# Patient Record
Sex: Male | Born: 1960 | Race: White | Hispanic: No | Marital: Married | Smoking: Never smoker
Health system: Southern US, Community
[De-identification: ages and names within clinical notes are randomized; demographics above are authoritative.]

---
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
## 2008-10-28 HISTORY — PX: BACK SURGERY: SHX140

## 2016-06-28 ENCOUNTER — Other Ambulatory Visit: Payer: Self-pay | Admitting: Physical Medicine and Rehabilitation

## 2016-06-28 ENCOUNTER — Ambulatory Visit
Admission: RE | Admit: 2016-06-28 | Discharge: 2016-06-28 | Disposition: A | Discharge status: Home / Self Care | Payer: No Typology Code available for payment source | Source: Ambulatory Visit | Attending: Physical Medicine and Rehabilitation | Admitting: Physical Medicine and Rehabilitation

## 2016-06-28 DIAGNOSIS — Z0000 Encounter for general adult medical examination without abnormal findings: Secondary | ICD-10-CM

## 2016-11-07 DIAGNOSIS — R911 Solitary pulmonary nodule: Secondary | ICD-10-CM | POA: Diagnosis not present

## 2016-11-22 ENCOUNTER — Other Ambulatory Visit (HOSPITAL_COMMUNITY): Payer: Self-pay | Admitting: Internal Medicine

## 2016-11-22 DIAGNOSIS — R918 Other nonspecific abnormal finding of lung field: Secondary | ICD-10-CM

## 2016-12-03 DIAGNOSIS — Z0000 Encounter for general adult medical examination without abnormal findings: Secondary | ICD-10-CM | POA: Diagnosis not present

## 2016-12-05 ENCOUNTER — Ambulatory Visit (HOSPITAL_COMMUNITY): Payer: Self-pay

## 2016-12-06 ENCOUNTER — Ambulatory Visit (HOSPITAL_COMMUNITY)
Admission: RE | Admit: 2016-12-06 | Discharge: 2016-12-06 | Disposition: A | Discharge status: Home / Self Care | Payer: 59 | Source: Ambulatory Visit | Attending: Internal Medicine | Admitting: Internal Medicine

## 2016-12-06 DIAGNOSIS — R918 Other nonspecific abnormal finding of lung field: Secondary | ICD-10-CM

## 2016-12-06 DIAGNOSIS — J8410 Pulmonary fibrosis, unspecified: Secondary | ICD-10-CM | POA: Insufficient documentation

## 2016-12-06 DIAGNOSIS — R911 Solitary pulmonary nodule: Secondary | ICD-10-CM | POA: Diagnosis not present

## 2016-12-10 DIAGNOSIS — E7800 Pure hypercholesterolemia, unspecified: Secondary | ICD-10-CM | POA: Diagnosis not present

## 2016-12-10 DIAGNOSIS — Z0000 Encounter for general adult medical examination without abnormal findings: Secondary | ICD-10-CM | POA: Diagnosis not present

## 2016-12-10 DIAGNOSIS — R911 Solitary pulmonary nodule: Secondary | ICD-10-CM | POA: Diagnosis not present

## 2018-06-15 DIAGNOSIS — E7800 Pure hypercholesterolemia, unspecified: Secondary | ICD-10-CM | POA: Diagnosis not present

## 2018-06-17 DIAGNOSIS — Z0000 Encounter for general adult medical examination without abnormal findings: Secondary | ICD-10-CM | POA: Diagnosis not present

## 2018-06-17 DIAGNOSIS — R911 Solitary pulmonary nodule: Secondary | ICD-10-CM | POA: Diagnosis not present

## 2018-06-17 DIAGNOSIS — E782 Mixed hyperlipidemia: Secondary | ICD-10-CM | POA: Diagnosis not present

## 2018-08-19 IMAGING — CR DG CHEST 1V
1 series · 1 of 1 positions shown · non-contrast
Comparison: None in PACs

CLINICAL DATA: Lung screening for employment, no chest complaints,
nonsmoker.

EXAM:
CHEST 1 VIEW

[w chest pa]
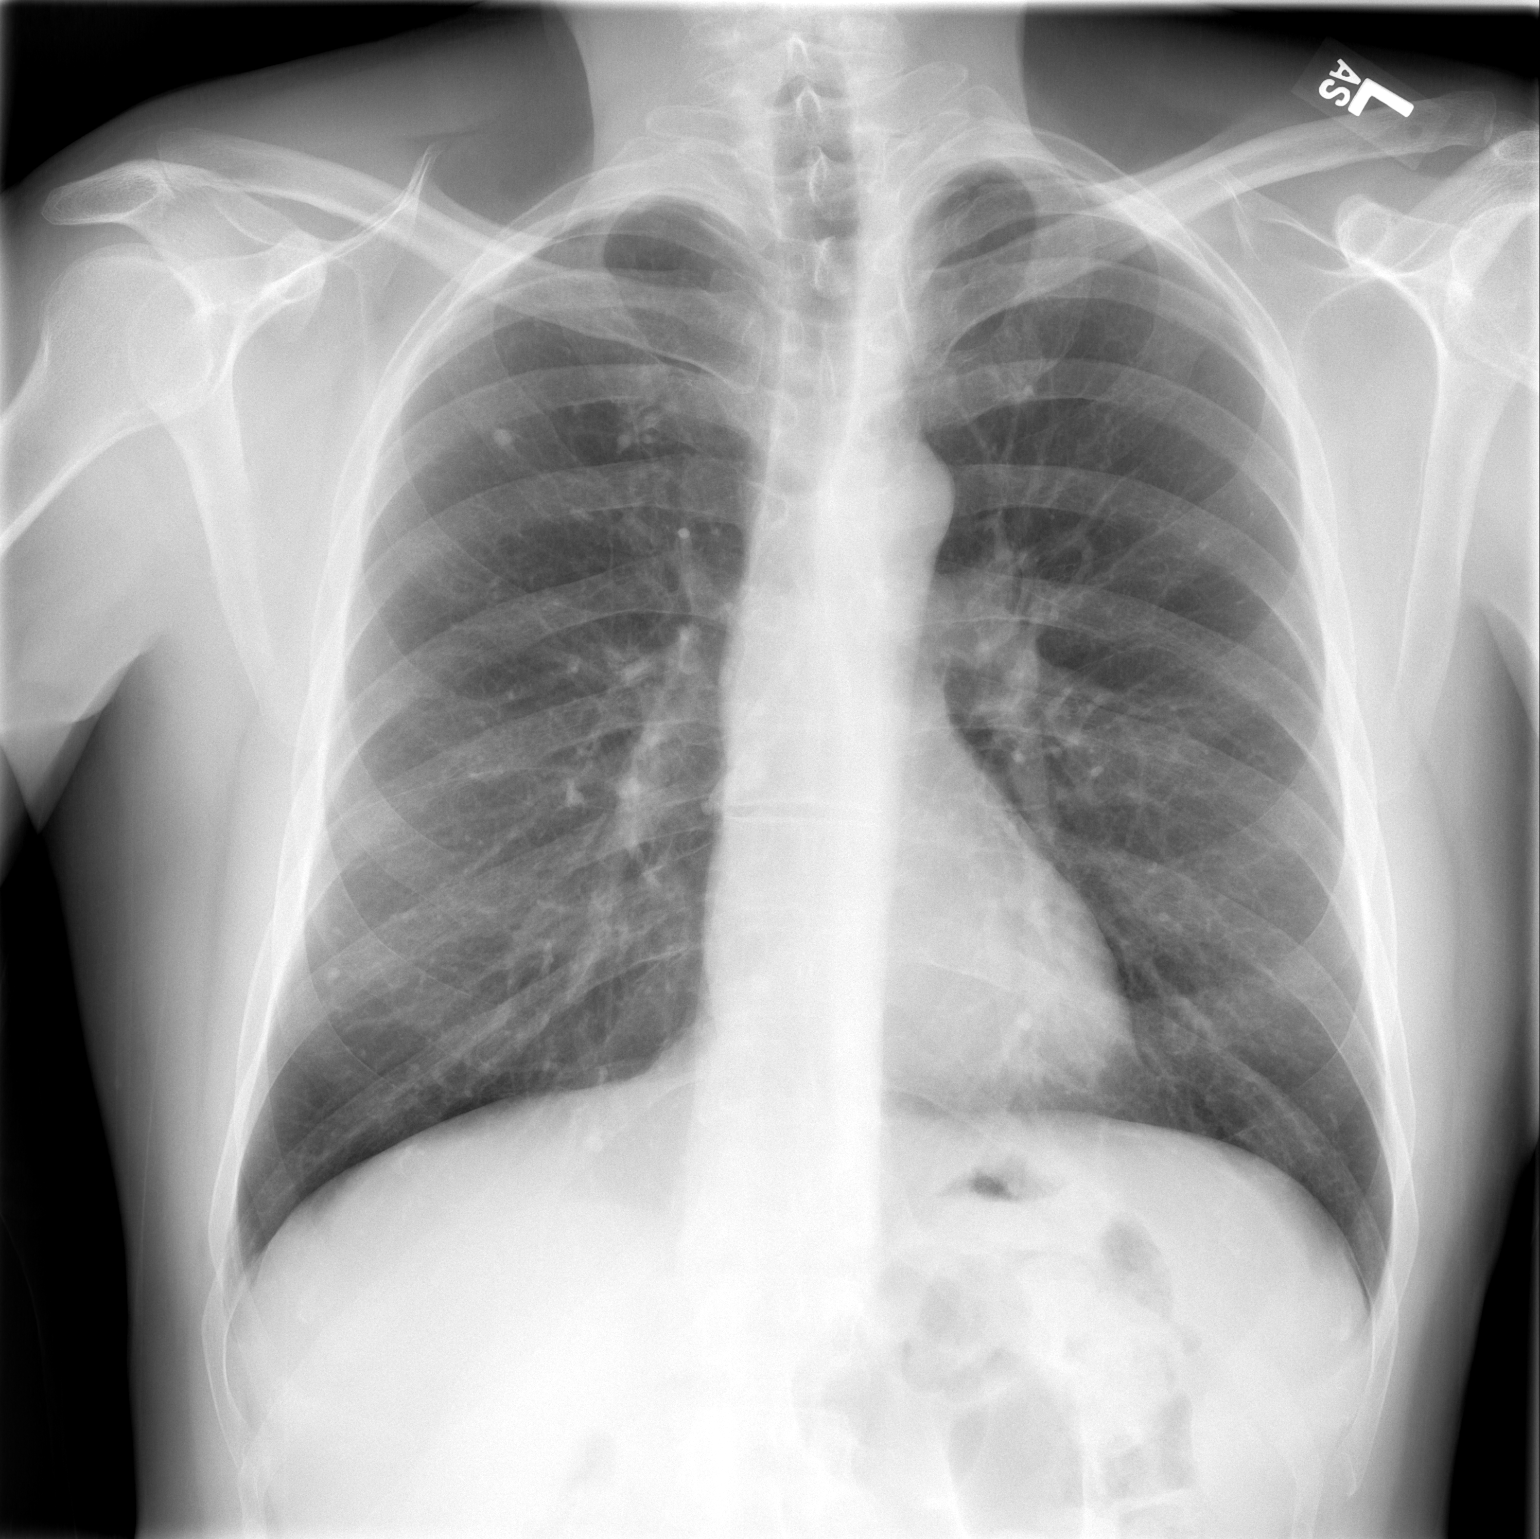

[1 of 1 positions shown; findings below may reference images not displayed]

FINDINGS: There are multiple calcified parenchymal nodules in both lungs
greatest in the right upper lobe. The largest measures approximately
4 mm in diameter. There is no alveolar infiltrate or pleural
effusion. The heart and pulmonary vascularity are normal. The
mediastinum is normal in width. The bony thorax exhibits no acute
abnormality.
IMPRESSION: Previous granulomatous infection. No acute cardiopulmonary
abnormality.

## 2018-09-14 ENCOUNTER — Other Ambulatory Visit: Payer: Self-pay | Admitting: Occupational Medicine

## 2018-09-14 ENCOUNTER — Ambulatory Visit: Payer: Self-pay

## 2018-09-14 DIAGNOSIS — Z0000 Encounter for general adult medical examination without abnormal findings: Secondary | ICD-10-CM

## 2018-10-06 DIAGNOSIS — Z0000 Encounter for general adult medical examination without abnormal findings: Secondary | ICD-10-CM | POA: Diagnosis not present

## 2018-10-06 DIAGNOSIS — R911 Solitary pulmonary nodule: Secondary | ICD-10-CM | POA: Diagnosis not present

## 2018-10-06 DIAGNOSIS — E782 Mixed hyperlipidemia: Secondary | ICD-10-CM | POA: Diagnosis not present

## 2018-10-06 DIAGNOSIS — I10 Essential (primary) hypertension: Secondary | ICD-10-CM | POA: Diagnosis not present

## 2019-07-12 ENCOUNTER — Encounter: Payer: Self-pay | Admitting: *Deleted

## 2019-08-03 ENCOUNTER — Ambulatory Visit: Payer: 59

## 2019-08-04 ENCOUNTER — Telehealth: Payer: Self-pay | Admitting: *Deleted

## 2019-08-04 ENCOUNTER — Encounter: Payer: Self-pay | Admitting: Gastroenterology

## 2019-08-04 ENCOUNTER — Ambulatory Visit: Payer: 59

## 2019-08-04 NOTE — Telephone Encounter (Signed)
Patient was a no show and letter sent  °

## 2019-08-04 NOTE — Telephone Encounter (Signed)
Noted  

## 2019-08-18 ENCOUNTER — Ambulatory Visit: Payer: 59

## 2019-08-31 ENCOUNTER — Ambulatory Visit (INDEPENDENT_AMBULATORY_CARE_PROVIDER_SITE_OTHER): Payer: Self-pay | Admitting: *Deleted

## 2019-08-31 ENCOUNTER — Other Ambulatory Visit: Payer: Self-pay

## 2019-08-31 DIAGNOSIS — Z1211 Encounter for screening for malignant neoplasm of colon: Secondary | ICD-10-CM

## 2019-08-31 MED ORDER — NA SULFATE-K SULFATE-MG SULF 17.5-3.13-1.6 GM/177ML PO SOLN: 1.0000 | Freq: Once | ORAL | 0 refills | Status: AC

## 2019-08-31 NOTE — Patient Instructions (Signed)
Mario Barajas  1960/12/15 MRN: [id number]     Procedure Date: 11/26/2019 Time to register: 9:30 am Place to register: [facility] Procedure Time: 10:30 am Scheduled provider: Dr. Gala Romney    PREPARATION FOR COLONOSCOPY WITH SUPREP BOWEL PREP KIT  Note: Suprep Bowel Prep Kit is a split-dose (2day) regimen. Consumption of BOTH 6-ounce bottles is required for a complete prep.  Please notify us immediately if you are diabetic, take iron supplements, or if you are on Coumadin or any other blood thinners.                                                                                                                                                   2 DAYS BEFORE PROCEDURE:  DATE: 11/24/2019   DAY: Wednesday Begin clear liquid diet AFTER your lunch meal. NO SOLID FOODS after this point.  1 DAY BEFORE PROCEDURE:  DATE: 11/25/2019   DAY: Thursday Continue clear liquids the entire day - NO SOLID FOOD.    At 6:00pm: Complete steps 1 through 4 below, using ONE (1) 6-ounce bottle, before going to bed. Step 1:  Pour ONE (1) 6-ounce bottle of SUPREP liquid into the mixing container.  Step 2:  Add cool drinking water to the 16 ounce line on the container and mix.  Note: Dilute the solution concentrate as directed prior to use. Step 3:  DRINK ALL the liquid in the container. Step 4:  You MUST drink an additional two (2) or more 16 ounce containers of water over the next one (1) hour.   Continue clear liquids.  DAY OF PROCEDURE:   DATE: 11/26/2019   DAY: Friday If you take medications for your heart, blood pressure, or breathing, you may take these medications.    5 hours before your procedure at :  5:30 am Step 1:  Pour ONE (1) 6-ounce bottle of SUPREP liquid into the mixing container.  Step 2:  Add cool drinking water to the 16 ounce line on the container and mix.  Note: Dilute the solution concentrate as directed prior to use. Step 3:  DRINK ALL the liquid in the container. Step 4:   You MUST drink an additional two (2) or more 16 ounce containers of water over the next one (1) hour. You MUST complete the final glass of water at least 3 hours before your colonoscopy.  Nothing by mouth past 7:30 am  You may take your morning medications with sip of water unless we have instructed otherwise.    Please see below for Dietary Information.  CLEAR LIQUIDS INCLUDE:  Water Jello (NOT red in color)   Ice Popsicles (NOT red in color)   Tea (sugar ok, no milk/cream) Powdered fruit flavored drinks  Coffee (sugar ok, no milk/cream) Gatorade/ Lemonade/ Kool-Aid  (NOT red in color)   Juice: apple, white grape,  white cranberry Soft drinks  Clear bullion, consomme, broth (fat free beef/chicken/vegetable)  Carbonated beverages (any kind)  Strained chicken noodle soup Hard Candy   Remember: Clear liquids are liquids that will allow you to see your fingers on the other side of a clear glass. Be sure liquids are NOT red in color, and not cloudy, but CLEAR.  DO NOT EAT OR DRINK ANY OF THE FOLLOWING:  Dairy products of any kind   Cranberry juice Tomato juice / V8 juice   Grapefruit juice Orange juice     Red grape juice  Do not eat any solid foods, including such foods as: cereal, oatmeal, yogurt, fruits, vegetables, creamed soups, eggs, bread, crackers, pureed foods in a blender, etc.   HELPFUL HINTS FOR DRINKING PREP SOLUTION:   Make sure prep is extremely cold. Mix and refrigerate the the morning of the prep. You may also put in the freezer.   You may try mixing some Crystal Light or Country Time Lemonade if you prefer. Mix in small amounts; add more if necessary.  Try drinking through a straw  Rinse mouth with water or a mouthwash between glasses, to remove after-taste.  Try sipping on a cold beverage /ice/ popsicles between glasses of prep.  Place a piece of sugar-free hard candy in mouth between glasses.  If you become nauseated, try consuming smaller amounts, or stretch  out the time between glasses. Stop for 30-60 minutes, then slowly start back drinking.     OTHER INSTRUCTIONS  You will need a responsible adult at least 60 years of age to accompany you and drive you home. This person must remain in the waiting room during your procedure. The hospital will cancel your procedure if you do not have a responsible adult with you.   1. Wear loose fitting clothing that is easily removed. 2. Leave jewelry and other valuables at home.  3. Remove all body piercing jewelry and leave at home. 4. Total time from sign-in until discharge is approximately 2-3 hours. 5. You should go home directly after your procedure and rest. You can resume normal activities the day after your procedure. 6. The day of your procedure you should not:  Drive  Make legal decisions  Operate machinery  Drink alcohol  Return to work   You may call the office (Dept: [phone]) before 5:00pm, or page the doctor on call [phone]) after 5:00pm, for further instructions, if necessary.   Insurance Information YOU WILL NEED TO CHECK WITH YOUR INSURANCE COMPANY FOR THE BENEFITS OF COVERAGE YOU HAVE FOR THIS PROCEDURE.  UNFORTUNATELY, NOT ALL INSURANCE COMPANIES HAVE BENEFITS TO COVER ALL OR PART OF THESE TYPES OF PROCEDURES.  IT IS YOUR RESPONSIBILITY TO CHECK YOUR BENEFITS, HOWEVER, WE WILL BE GLAD TO ASSIST YOU WITH ANY CODES YOUR INSURANCE COMPANY MAY NEED.    PLEASE NOTE THAT MOST INSURANCE COMPANIES WILL NOT COVER A SCREENING COLONOSCOPY FOR PEOPLE UNDER THE AGE OF 50  IF YOU HAVE BCBS INSURANCE, YOU MAY HAVE BENEFITS FOR A SCREENING COLONOSCOPY BUT IF POLYPS ARE FOUND THE DIAGNOSIS WILL CHANGE AND THEN YOU MAY HAVE A DEDUCTIBLE THAT WILL NEED TO BE MET. SO PLEASE MAKE SURE YOU CHECK YOUR BENEFITS FOR A SCREENING COLONOSCOPY AS WELL AS A DIAGNOSTIC COLONOSCOPY.

## 2019-08-31 NOTE — Progress Notes (Addendum)
Gastroenterology Pre-Procedure Review  Request Date: 08/31/2019 Requesting Physician: Dr. Wende Neighbors, Last TCS done 10 years ago in [place], no polyps per pt  PATIENT REVIEW QUESTIONS: The patient responded to the following health history questions as indicated:    1. Diabetes Melitis: no 2. Joint replacements in the past 12 months: no 3. Major health problems in the past 3 months: no 4. Has an artificial valve or MVP: yes, pt says he has mitral valve prolapse (30 years ago) 5. Has a defibrillator: no 6. Has been advised in past to take antibiotics in advance of a procedure like teeth cleaning: yes 7. Family history of colon cancer: no  8. Alcohol Use: yes, 2 or 3 beers on weekend 9. Illicit drug Use: no 10. History of sleep apnea: no  11. History of coronary artery or other vascular stents placed within the last 12 months: no 12. History of any prior anesthesia complications: no 13. There is no height or weight on file to calculate BMI. ht: 6'0 wt: 195 lbs    MEDICATIONS & ALLERGIES:    Patient reports the following regarding taking any blood thinners:   Plavix? no Aspirin? no Coumadin? no Brilinta? no Xarelto? no Eliquis? no Pradaxa? no Savaysa? no Effient? no  Patient confirms/reports the following medications:  Current Outpatient Medications  Medication Sig Dispense Refill  . Cholecalciferol (VITAMIN D3 PO) Take by mouth daily.    . Coenzyme Q10 (CO Q10 PO) Take by mouth daily.    Marland Kitchen MAGNESIUM PO Take by mouth daily.    . Multiple Vitamins-Minerals (MULTIVITAMIN ADULTS 50+) TABS Take by mouth daily.    Marland Kitchen olmesartan (BENICAR) 20 MG tablet Take 20 mg by mouth daily.     No current facility-administered medications for this visit.     Patient confirms/reports the following allergies:  No Known Allergies  No orders of the defined types were placed in this encounter.   [place] INFORMATION Primary Insurance: [facility]  [place] #: [id number],  Group #: [id number] Pre-Cert / Auth  required: Yes, approved online 11/26/2019 to [id number] Pre-Cert / Josem Kaufmann #: [id number]  SCHEDULE INFORMATION: Procedure has been scheduled as follows:  Date: 11/26/2019, Time: 10:30 Location: [facility] with Dr. Gala Romney  This Gastroenterology Pre-Precedure Review Form is being routed to the following provider(s): Neil Crouch, PA-C

## 2019-09-01 NOTE — Progress Notes (Signed)
Ok to schedule.

## 2019-09-01 NOTE — Addendum Note (Signed)
Addended by: Metro Kung on: 09/01/2019 01:43 PM   Modules accepted: Orders, SmartSet

## 2019-10-04 ENCOUNTER — Telehealth: Payer: Self-pay

## 2019-10-04 NOTE — Telephone Encounter (Signed)
Pt called and wants to cancel his TCS due to work and covid. Pt will call back when he is able to have his procedure done.

## 2019-10-05 NOTE — Telephone Encounter (Signed)
Noted.  Called and notified Hoyle Sauer in Endo.

## 2019-11-24 ENCOUNTER — Other Ambulatory Visit (HOSPITAL_COMMUNITY): Payer: 59

## 2019-11-26 ENCOUNTER — Ambulatory Visit (HOSPITAL_COMMUNITY): Admit: 2019-11-26 | Payer: 59 | Admitting: Internal Medicine

## 2019-11-26 ENCOUNTER — Encounter (HOSPITAL_COMMUNITY): Payer: Self-pay

## 2019-11-26 SURGERY — COLONOSCOPY
Anesthesia: Moderate Sedation

## 2020-08-29 ENCOUNTER — Other Ambulatory Visit (HOSPITAL_COMMUNITY): Payer: Self-pay | Admitting: Internal Medicine

## 2020-08-29 DIAGNOSIS — I25119 Atherosclerotic heart disease of native coronary artery with unspecified angina pectoris: Secondary | ICD-10-CM

## 2020-09-11 ENCOUNTER — Other Ambulatory Visit: Payer: Self-pay

## 2020-09-11 ENCOUNTER — Ambulatory Visit (HOSPITAL_COMMUNITY)
Admission: RE | Admit: 2020-09-11 | Discharge: 2020-09-11 | Disposition: A | Discharge status: Home / Self Care | Payer: 59 | Source: Ambulatory Visit | Attending: Internal Medicine | Admitting: Internal Medicine

## 2020-09-11 DIAGNOSIS — I25119 Atherosclerotic heart disease of native coronary artery with unspecified angina pectoris: Secondary | ICD-10-CM | POA: Diagnosis not present

## 2020-11-04 IMAGING — DX DG CHEST 1V
1 series · 1 of 1 positions shown · non-contrast
Comparison: Chest CT 12/06/2016 and radiograph 06/28/2016

CLINICAL DATA: Physical exam.

EXAM:
CHEST  1 VIEW

[chest pa]
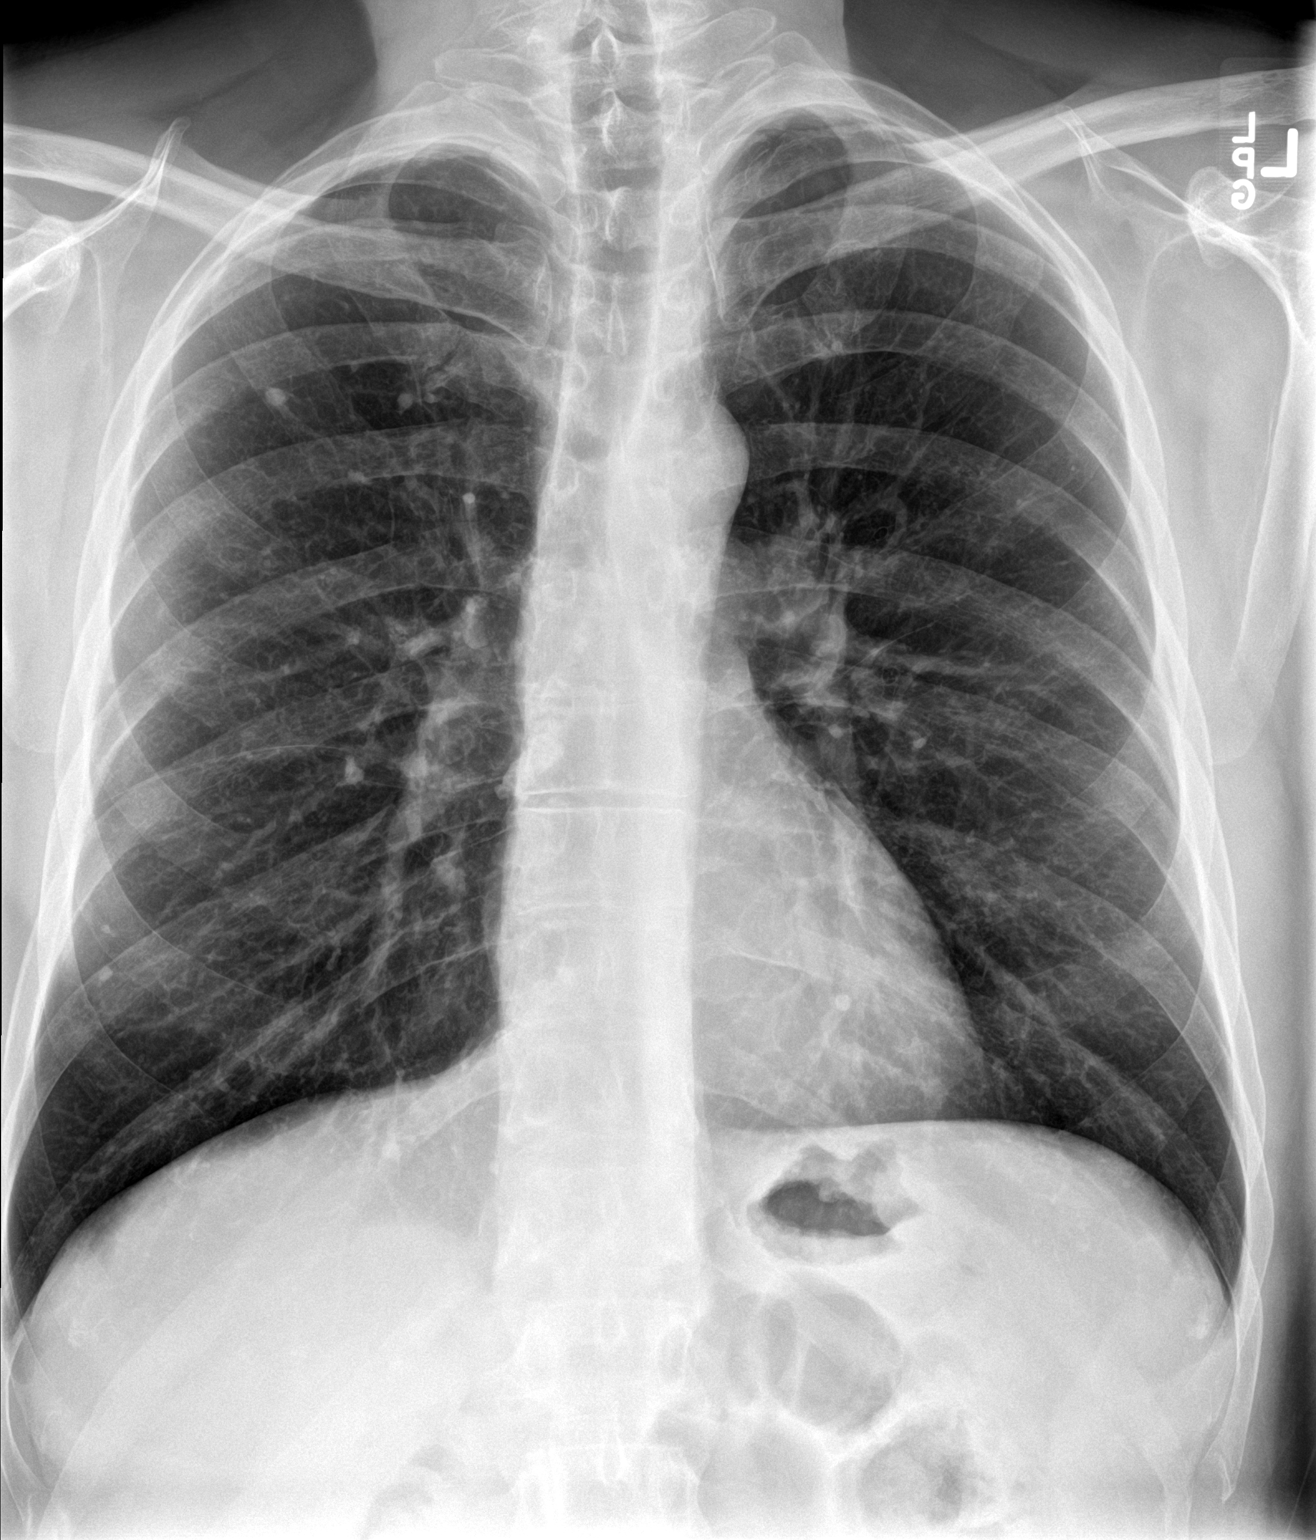

[1 of 1 positions shown; findings below may reference images not displayed]

FINDINGS: The cardiomediastinal silhouette is unchanged and within normal
limits. Small calcified mediastinal lymph nodes and small calcified
lung nodules are similar to the prior radiograph. No airspace
consolidation, edema, pleural effusion, pneumothorax is identified.
No acute osseous abnormality is seen.
IMPRESSION: Prior granulomatous infection without evidence of active
cardiopulmonary disease.

## 2021-08-29 DIAGNOSIS — L821 Other seborrheic keratosis: Secondary | ICD-10-CM

## 2021-08-29 DIAGNOSIS — E782 Mixed hyperlipidemia: Secondary | ICD-10-CM | POA: Insufficient documentation

## 2021-08-29 DIAGNOSIS — I10 Essential (primary) hypertension: Secondary | ICD-10-CM | POA: Insufficient documentation

## 2021-08-29 DIAGNOSIS — D229 Melanocytic nevi, unspecified: Secondary | ICD-10-CM

## 2021-08-29 HISTORY — DX: Other seborrheic keratosis: L82.1

## 2021-08-29 HISTORY — DX: Essential (primary) hypertension: I10

## 2021-08-29 HISTORY — DX: Mixed hyperlipidemia: E78.2

## 2021-08-29 HISTORY — DX: Melanocytic nevi, unspecified: D22.9

## 2021-09-10 DIAGNOSIS — I6529 Occlusion and stenosis of unspecified carotid artery: Secondary | ICD-10-CM | POA: Insufficient documentation

## 2021-09-10 DIAGNOSIS — L989 Disorder of the skin and subcutaneous tissue, unspecified: Secondary | ICD-10-CM | POA: Insufficient documentation

## 2021-09-10 DIAGNOSIS — R911 Solitary pulmonary nodule: Secondary | ICD-10-CM

## 2021-09-10 HISTORY — DX: Disorder of the skin and subcutaneous tissue, unspecified: L98.9

## 2021-09-10 HISTORY — DX: Occlusion and stenosis of unspecified carotid artery: I65.29

## 2021-09-10 HISTORY — DX: Solitary pulmonary nodule: R91.1

## 2021-09-11 ENCOUNTER — Other Ambulatory Visit: Payer: Self-pay | Admitting: Internal Medicine

## 2021-09-11 ENCOUNTER — Other Ambulatory Visit (HOSPITAL_COMMUNITY): Payer: Self-pay | Admitting: Internal Medicine

## 2021-09-11 DIAGNOSIS — I6529 Occlusion and stenosis of unspecified carotid artery: Secondary | ICD-10-CM

## 2021-09-19 ENCOUNTER — Other Ambulatory Visit: Payer: Self-pay

## 2021-09-19 ENCOUNTER — Ambulatory Visit (HOSPITAL_COMMUNITY)
Admission: RE | Admit: 2021-09-19 | Discharge: 2021-09-19 | Disposition: A | Discharge status: Home / Self Care | Payer: 59 | Source: Ambulatory Visit | Attending: Internal Medicine | Admitting: Internal Medicine

## 2021-09-19 DIAGNOSIS — I6529 Occlusion and stenosis of unspecified carotid artery: Secondary | ICD-10-CM | POA: Diagnosis not present

## 2021-12-22 DIAGNOSIS — H6691 Otitis media, unspecified, right ear: Secondary | ICD-10-CM | POA: Diagnosis not present

## 2021-12-22 DIAGNOSIS — H6690 Otitis media, unspecified, unspecified ear: Secondary | ICD-10-CM | POA: Insufficient documentation

## 2021-12-22 HISTORY — DX: Otitis media, unspecified, unspecified ear: H66.90

## 2022-06-20 DIAGNOSIS — H903 Sensorineural hearing loss, bilateral: Secondary | ICD-10-CM | POA: Diagnosis not present

## 2022-06-25 DIAGNOSIS — L989 Disorder of the skin and subcutaneous tissue, unspecified: Secondary | ICD-10-CM | POA: Diagnosis not present

## 2022-08-15 DIAGNOSIS — H903 Sensorineural hearing loss, bilateral: Secondary | ICD-10-CM | POA: Diagnosis not present

## 2022-10-31 DIAGNOSIS — Z0001 Encounter for general adult medical examination with abnormal findings: Secondary | ICD-10-CM | POA: Diagnosis not present

## 2022-10-31 DIAGNOSIS — E782 Mixed hyperlipidemia: Secondary | ICD-10-CM | POA: Diagnosis not present

## 2022-11-02 IMAGING — US US CAROTID DUPLEX BILAT
1 series · 13 of 24 positions shown · non-contrast
Comparison: None.

CLINICAL DATA: Atherosclerosis of coronary arteries.

EXAM:
BILATERAL CAROTID DUPLEX ULTRASOUND
TECHNIQUE: Gray scale imaging, color Doppler and duplex ultrasound were
performed of bilateral carotid and vertebral arteries in the neck.

[Series 1: us carotid bilateral · 13 of 70 slices shown]
[im 1/70]
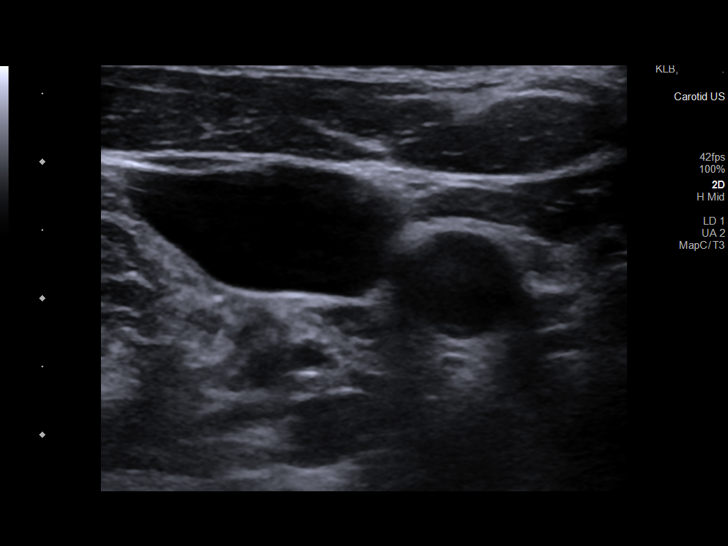
[im 7/70]
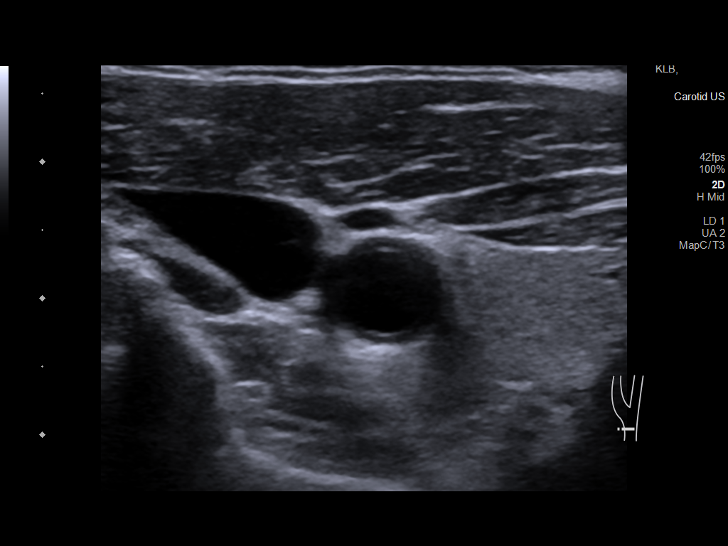
[im 13/70]
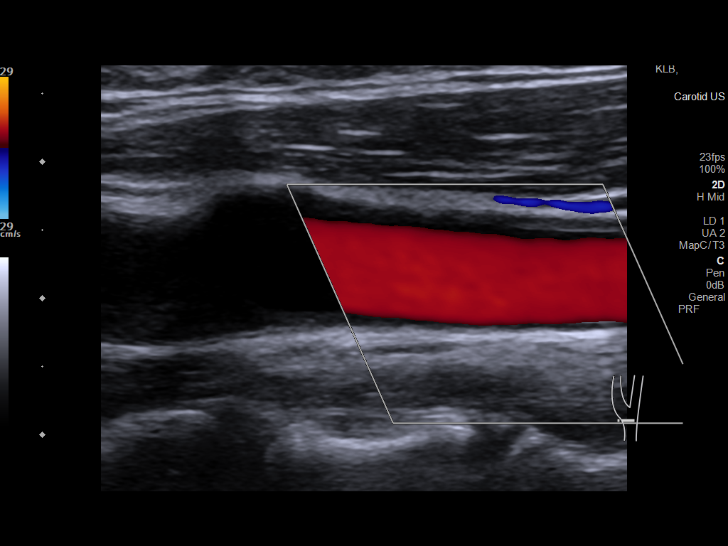
[im 19/70]
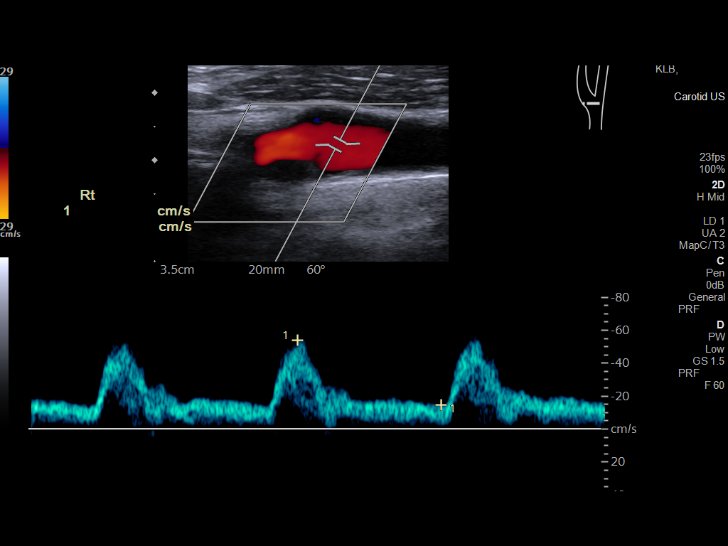
[im 25/70]
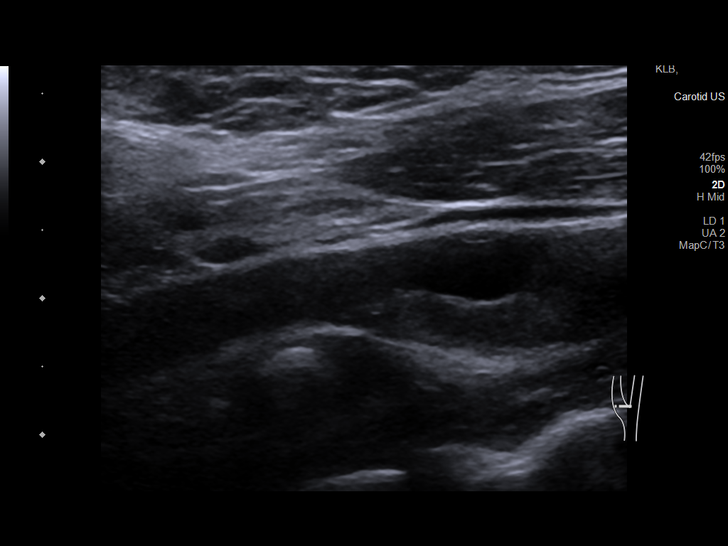
[im 31/70]
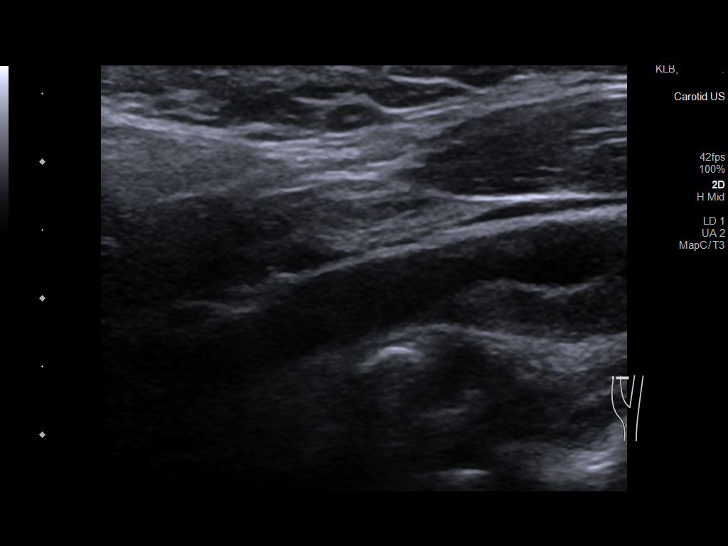
[im 37/70]
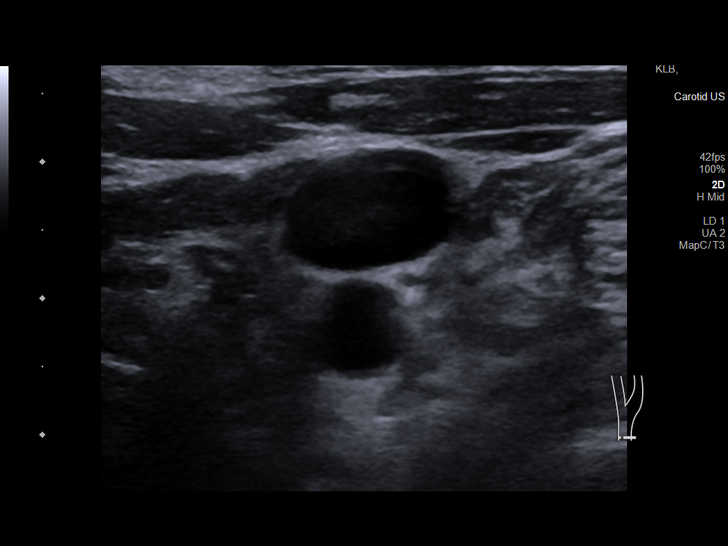
[im 40/70]
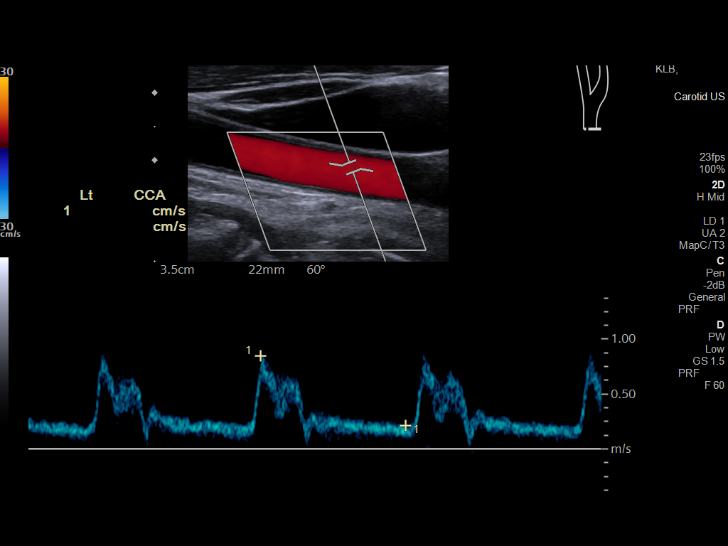
[im 46/70]
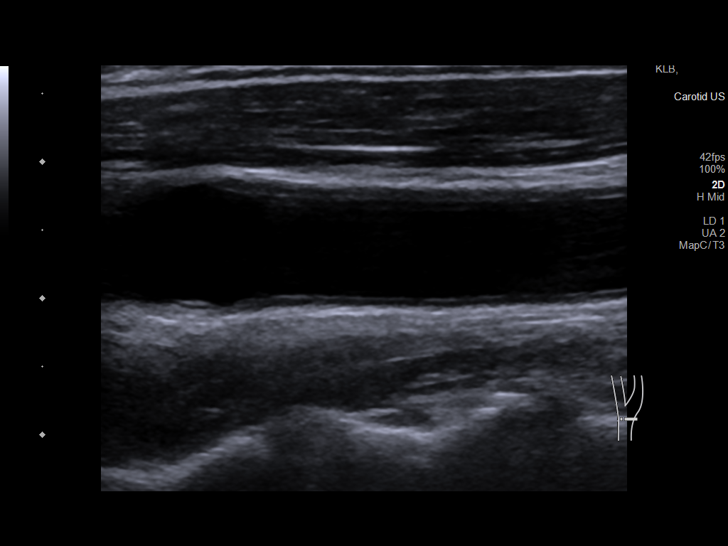
[im 52/70]
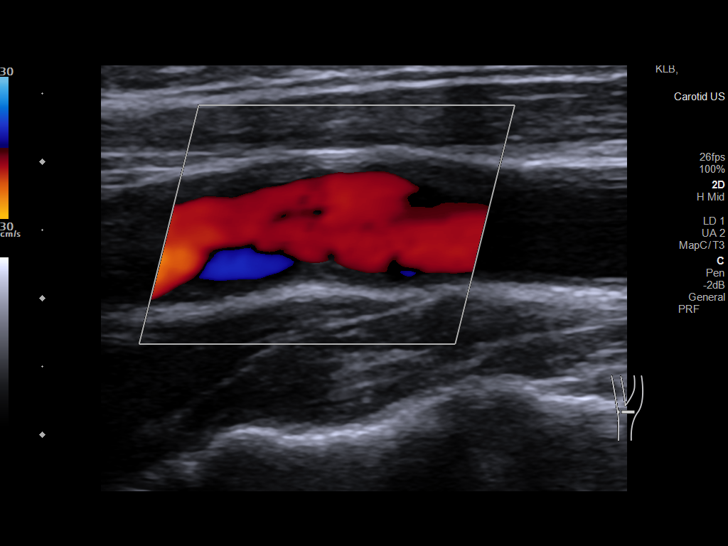
[im 58/70]
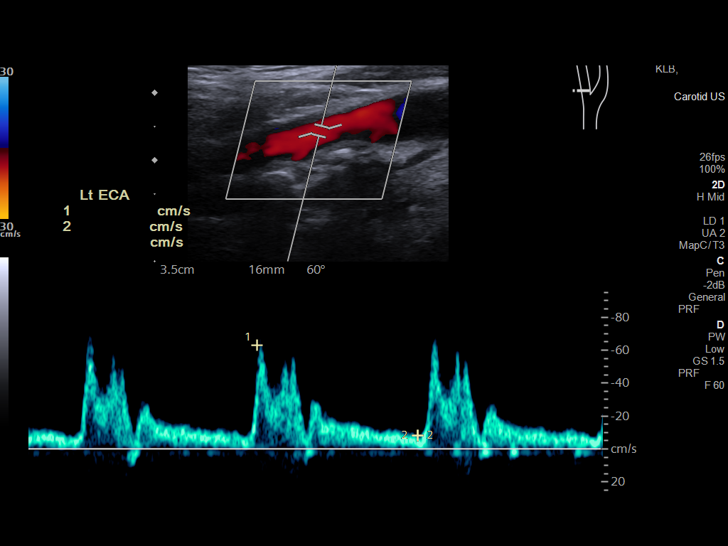
[im 64/70]
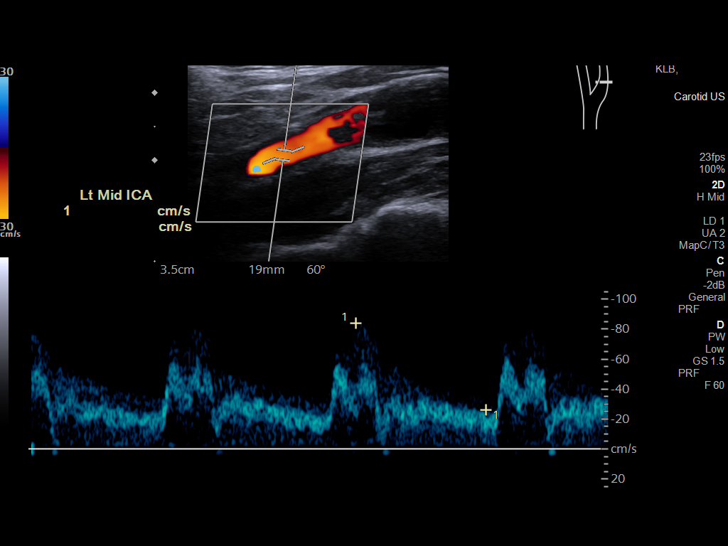
[im 70/70]
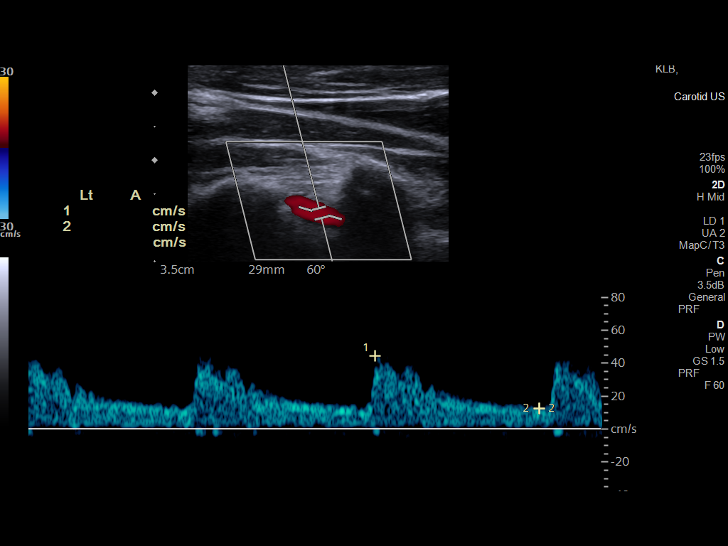

[13 of 24 positions shown; findings below may reference images not displayed]

FINDINGS: Criteria: Quantification of carotid stenosis is based on velocity
parameters that correlate the residual internal carotid diameter
with NASCET-based stenosis levels, using the diameter of the distal
internal carotid lumen as the denominator for stenosis measurement.

The following velocity measurements were obtained:

RIGHT

ICA: 270/91 cm/sec

CCA: 79/16 cm/sec

SYSTOLIC ICA/CCA RATIO:

ECA: 86 cm/sec

LEFT

ICA: 84/26 cm/sec

CCA: 78/20 cm/sec

SYSTOLIC ICA/CCA RATIO:

ECA: 63 cm/sec

RIGHT CAROTID ARTERY: Heterogeneous plaque at the right carotid bulb
and proximal internal carotid artery. External carotid artery is
patent with normal waveform. Elevated peak systolic velocity in the
proximal internal carotid artery measuring 270 cm/sec. The mid and
distal internal carotid artery are patent.

RIGHT VERTEBRAL ARTERY: Antegrade flow and normal waveform in the
right vertebral artery.

LEFT CAROTID ARTERY: Heterogeneous plaque at the left carotid bulb.
External carotid artery is patent with normal waveform. Intimal
thickening or minimal plaque in the left internal carotid artery.
Normal waveforms and velocities in the internal carotid artery.

LEFT VERTEBRAL ARTERY: Antegrade flow and normal waveform in the
left vertebral artery.
IMPRESSION: 1. Atherosclerotic disease involving bilateral carotid arteries,
right side greater than left.
2. Estimated degree of stenosis in right internal carotid artery is
greater than 70%.
3. Estimated degree of stenosis in left internal carotid artery is
less than 50%.
4. Patent vertebral arteries bilaterally.

These results will be called to the ordering clinician or
representative by the Radiologist Assistant, and communication
documented in the PACS or [REDACTED].

## 2022-11-07 ENCOUNTER — Other Ambulatory Visit (HOSPITAL_COMMUNITY): Payer: Self-pay | Admitting: Internal Medicine

## 2022-11-07 DIAGNOSIS — E782 Mixed hyperlipidemia: Secondary | ICD-10-CM | POA: Diagnosis not present

## 2022-11-07 DIAGNOSIS — R7301 Impaired fasting glucose: Secondary | ICD-10-CM | POA: Diagnosis not present

## 2022-11-07 DIAGNOSIS — I6523 Occlusion and stenosis of bilateral carotid arteries: Secondary | ICD-10-CM | POA: Diagnosis not present

## 2022-11-07 DIAGNOSIS — L989 Disorder of the skin and subcutaneous tissue, unspecified: Secondary | ICD-10-CM | POA: Diagnosis not present

## 2022-11-07 DIAGNOSIS — I6529 Occlusion and stenosis of unspecified carotid artery: Secondary | ICD-10-CM

## 2022-11-07 DIAGNOSIS — Z0001 Encounter for general adult medical examination with abnormal findings: Secondary | ICD-10-CM | POA: Diagnosis not present

## 2022-11-07 DIAGNOSIS — R911 Solitary pulmonary nodule: Secondary | ICD-10-CM | POA: Diagnosis not present

## 2022-11-07 DIAGNOSIS — I10 Essential (primary) hypertension: Secondary | ICD-10-CM | POA: Diagnosis not present

## 2022-11-07 HISTORY — DX: Impaired fasting glucose: R73.01

## 2022-11-11 ENCOUNTER — Encounter (INDEPENDENT_AMBULATORY_CARE_PROVIDER_SITE_OTHER): Payer: Self-pay | Admitting: *Deleted

## 2022-11-19 ENCOUNTER — Ambulatory Visit (HOSPITAL_COMMUNITY)
Admission: RE | Admit: 2022-11-19 | Discharge: 2022-11-19 | Disposition: A | Discharge status: Home / Self Care | Payer: BC Managed Care – PPO | Source: Ambulatory Visit | Attending: Internal Medicine | Admitting: Internal Medicine

## 2022-11-19 DIAGNOSIS — I6529 Occlusion and stenosis of unspecified carotid artery: Secondary | ICD-10-CM

## 2022-11-19 DIAGNOSIS — I6523 Occlusion and stenosis of bilateral carotid arteries: Secondary | ICD-10-CM | POA: Diagnosis not present

## 2022-12-03 ENCOUNTER — Encounter: Payer: Self-pay | Admitting: Vascular Surgery

## 2022-12-04 ENCOUNTER — Ambulatory Visit: Payer: BC Managed Care – PPO | Admitting: Vascular Surgery

## 2022-12-04 ENCOUNTER — Encounter: Payer: Self-pay | Admitting: Vascular Surgery

## 2022-12-04 VITALS — BP 148/88 | HR 57 | Temp 98.1°F | Ht 72.0 in | Wt 194.4 lb

## 2022-12-04 DIAGNOSIS — I6521 Occlusion and stenosis of right carotid artery: Secondary | ICD-10-CM | POA: Diagnosis not present

## 2022-12-04 NOTE — Progress Notes (Signed)
[facility] [place]  Patient name: Mario Barajas MRN: [id number] DOB: 13-Apr-1961 Sex: male  REASON FOR CONSULT: Evaluation right carotid stenosis  HPI: Mario Barajas is a 62 y.o. male, who is here today for discussion of carotid duplex suggesting severe carotid disease.  He is completely asymptomatic.  He denies any prior history of amaurosis fugax, aphasia, TIA or stroke.  He was found to have carotid bruit as the initial cause for his duplex several years ago.  He recently underwent repeat study at [facility] radiology.  He was felt to have potentially high-grade stenosis on the right.  Not had any cardiac disease.  He is not a cigarette smoker.  Past Medical History:  Diagnosis Date   Acute otitis media 12/22/2021   Carotid atherosclerosis 09/10/2021   Essential hypertension 08/29/2021   Impaired fasting glucose 11/07/2022   Lung nodule 09/10/2021   Melanocytic nevus 08/29/2021   Mixed hyperlipidemia 08/29/2021   Seborrheic keratosis 08/29/2021   Skin lesion 09/10/2021    Family History  Problem Relation Age of Onset   Heart disease Mother    Cancer Father    Cancer Sister     SOCIAL HISTORY: Social History   Socioeconomic History   Marital status: Married    Spouse name: Not on file   Number of children: Not on file   Years of education: Not on file   Highest education level: Not on file  Occupational History   Not on file  Tobacco Use   Smoking status: Never   Smokeless tobacco: Never  Vaping Use   Vaping Use: Never used  Substance and Sexual Activity   Alcohol use: Yes    Alcohol/week: 2.0 standard drinks of alcohol    Types: 2 Standard drinks or equivalent per week   Drug use: Never   Sexual activity: Not on file  Other Topics Concern   Not on file  Social History Narrative   Not on file   Social Determinants of Health   Financial Resource Strain: Not on file  Food Insecurity: Not on file   Transportation Needs: Not on file  Physical Activity: Not on file  Stress: Not on file  Social Connections: Not on file  Intimate Partner Violence: Not on file    No Known Allergies  Current Outpatient Medications  Medication Sig Dispense Refill   Cholecalciferol (VITAMIN D3 PO) Take by mouth daily.     Coenzyme Q10 (CO Q10 PO) Take by mouth daily.     MAGNESIUM PO Take by mouth daily.     Multiple Vitamins-Minerals (MULTIVITAMIN ADULTS 50+) TABS Take by mouth daily.     olmesartan (BENICAR) 20 MG tablet Take 20 mg by mouth daily.     No current facility-administered medications for this visit.    REVIEW OF SYSTEMS:  '[X]'$  denotes positive finding, '[ ]'$  denotes negative finding Cardiac  Comments:  Chest pain or chest pressure:    Shortness of breath upon exertion:    Short of breath when lying flat:    Irregular heart rhythm:        Vascular    Pain in calf, thigh, or hip brought on by ambulation:    Pain in feet at night that wakes you up from your sleep:     Blood clot in your veins:    Leg swelling:         Pulmonary    Oxygen at home:    Productive cough:  Wheezing:         Neurologic    Sudden weakness in arms or legs:     Sudden numbness in arms or legs:     Sudden onset of difficulty speaking or slurred speech:    Temporary loss of vision in one eye:     Problems with dizziness:         Gastrointestinal    Blood in stool:     Vomited blood:         Genitourinary    Burning when urinating:     Blood in urine:        Psychiatric    Major depression:         Hematologic    Bleeding problems:    Problems with blood clotting too easily:        Skin    Rashes or ulcers:        Constitutional    Fever or chills:      PHYSICAL EXAM: Vitals:   12/04/22 1515 12/04/22 1518  BP: (!) 153/91 (!) 148/88  Pulse: (!) 57   Temp: 98.1 F (36.7 C)   Weight: 194 lb 6.4 oz (88.2 kg)   Height: 6' (1.829 m)     GENERAL: The patient is a well-nourished  male, in no acute distress. The vital signs are documented above. CARDIOVASCULAR: He does have a soft right carotid bruit.  I do not appreciate a left carotid bruit.  He has a 2+ radial pulses bilaterally PULMONARY: There is good air exchange  MUSCULOSKELETAL: There are no major deformities or cyanosis. NEUROLOGIC: No focal weakness or paresthesias are detected. SKIN: There are no ulcers or rashes noted. PSYCHIATRIC: The patient has a normal affect.  DATA:  Carotid duplex 11/19/2022 was reviewed.  He was felt to have a at least 50-70 and more probably a 70 to 99% stenosis on the right.  There was no significant stenosis on the left.  MEDICAL ISSUES: Had a long discussion with the patient regarding this.  I explained the risk of stroke with high-grade asymptomatic disease.  I discussed carotid endarterectomy for treatment of this.  Feel that he certainly is indeterminant regarding his duplex.  I explained that we generally do endarterectomy based on duplex alone if we can see certain criteria.  I have recommended that we repeat his right carotid duplex to make further recommendation.  He understands that there is a chance that he would require CT angiogram if his duplex is indeterminant which would be unusual.  He works in [place] and requested his study be done in our [place] office.  We will coordinate this and also discussion with one of the [facility] surgeons with the study   Rosetta Posner, MD [facility] [phone] Pager [phone]  Note: Portions of this report may have been transcribed using voice recognition software.  Every effort has been made to ensure accuracy; however, inadvertent computerized transcription errors may still be present.

## 2022-12-05 ENCOUNTER — Other Ambulatory Visit: Payer: Self-pay | Admitting: *Deleted

## 2022-12-05 DIAGNOSIS — I6521 Occlusion and stenosis of right carotid artery: Secondary | ICD-10-CM

## 2022-12-18 ENCOUNTER — Other Ambulatory Visit: Payer: Self-pay

## 2022-12-18 DIAGNOSIS — I6521 Occlusion and stenosis of right carotid artery: Secondary | ICD-10-CM

## 2022-12-19 ENCOUNTER — Telehealth: Payer: Self-pay

## 2022-12-19 ENCOUNTER — Ambulatory Visit (HOSPITAL_COMMUNITY): Payer: BC Managed Care – PPO

## 2022-12-19 ENCOUNTER — Ambulatory Visit: Payer: BC Managed Care – PPO | Admitting: Vascular Surgery

## 2022-12-19 NOTE — Telephone Encounter (Signed)
-----   Message from [facility] sent at 12/19/2022  9:38 AM EST ----- Regarding: RE: CTA Appt linked to 2/26 CT - per tech at [facility] scans can be done at the same time   ----- Message ----- From: Kaleen Mask, LPN Sent: [id number]  10:20 AM EST To: April H Pait; Cathlean Cower; Roosvelt Maser; # Subject: CTA                                            Please schedule prior to appt on 01/16/2023.  Thanks!

## 2022-12-23 ENCOUNTER — Other Ambulatory Visit (HOSPITAL_COMMUNITY): Payer: 59

## 2022-12-23 ENCOUNTER — Ambulatory Visit (HOSPITAL_COMMUNITY)
Admission: RE | Admit: 2022-12-23 | Discharge: 2022-12-23 | Disposition: A | Discharge status: Home / Self Care | Payer: BC Managed Care – PPO | Source: Ambulatory Visit | Attending: Vascular Surgery | Admitting: Vascular Surgery

## 2022-12-23 ENCOUNTER — Encounter (HOSPITAL_COMMUNITY): Payer: Self-pay

## 2022-12-23 DIAGNOSIS — I6521 Occlusion and stenosis of right carotid artery: Secondary | ICD-10-CM | POA: Insufficient documentation

## 2022-12-23 DIAGNOSIS — I6523 Occlusion and stenosis of bilateral carotid arteries: Secondary | ICD-10-CM | POA: Diagnosis not present

## 2022-12-23 MED ORDER — IOHEXOL 350 MG/ML SOLN
75.0000 mL | Freq: Once | INTRAVENOUS | Status: AC | PRN
  Administered 2022-12-23: 75 mL via INTRAVENOUS

## 2023-01-16 ENCOUNTER — Ambulatory Visit: Payer: BC Managed Care – PPO | Admitting: Vascular Surgery

## 2023-02-06 ENCOUNTER — Ambulatory Visit: Payer: BC Managed Care – PPO | Admitting: Vascular Surgery

## 2023-02-06 ENCOUNTER — Encounter: Payer: Self-pay | Admitting: Vascular Surgery

## 2023-02-06 VITALS — BP 155/82 | HR 51 | Temp 98.0°F | Resp 20 | Ht 72.0 in | Wt 185.9 lb

## 2023-02-06 DIAGNOSIS — I6521 Occlusion and stenosis of right carotid artery: Secondary | ICD-10-CM | POA: Diagnosis not present

## 2023-02-06 NOTE — Progress Notes (Signed)
ASSESSMENT & PLAN   ASYMPTOMATIC 60 TO 79% RIGHT CAROTID STENOSIS: Based on our velocity criteria this is a 60 to 79% carotid stenosis but at the low end of that spectrum.  The CT angio suggest a 55% right carotid stenosis.  There is no stenosis on the left. Thus,  all things considered I think he has about a 60% right carotid stenosis.  He is asymptomatic.  I explained we would not consider carotid endarterectomy unless the stenosis progressed to greater than 80% or develop new right hemispheric symptoms.  We have discussed the potential symptoms of cerebrovascular disease.  I have instructed him to begin taking 81 mg of aspirin daily.  I think he would also benefit from a low-dose statin but he would like to discuss this with Dr. Margo Aye understandably.  I have ordered a follow-up carotid duplex scan in 6 months and he will be seen on the PA schedule at that time.  I have explained to him that I will be retiring and also Dr. Arbie Cookey is retiring.  If there is any improvement in the stenosis then we might be able to go to a 1 year follow-up protocol.  I  REASON FOR CONSULT:    Right carotid stenosis.  The consult is requested by Dr. Arbie Cookey.  His primary care physician is Dr. Dwana Melena.  HPI:   Mario Barajas is a 62 y.o. male who was seen by Dr. Tawanna Cooler Early on 12/04/2022 with a right carotid stenosis.  Patient had a carotid bruit which prompted a carotid duplex scan that was done in January and suggested a significant right carotid stenosis.  This visit prompted a CT angiogram which showed a moderate right carotid stenosis.  The patient is sent for vascular consultation.  On my history, the patient was found to have a carotid bruit.  This prompted a duplex scan which showed a moderate right carotid stenosis as described above.  He denies any history of stroke, TIAs, expressive or receptive aphasia, or amaurosis fugax.  He is not on aspirin he is not on a statin.  He is not a smoker.  His only risk factor for  peripheral arterial disease is hypertension.  He denies any history of diabetes, hypercholesterolemia, family history of premature cardiovascular disease, or tobacco use.  He denies any history of claudication or rest pain.  Past Medical History:  Diagnosis Date   Acute otitis media 12/22/2021   Carotid atherosclerosis 09/10/2021   Essential hypertension 08/29/2021   Impaired fasting glucose 11/07/2022   Lung nodule 09/10/2021   Melanocytic nevus 08/29/2021   Mixed hyperlipidemia 08/29/2021   Seborrheic keratosis 08/29/2021   Skin lesion 09/10/2021    Family History  Problem Relation Age of Onset   Heart disease Mother    Cancer Father    Cancer Sister     SOCIAL HISTORY: Social History   Tobacco Use   Smoking status: Never   Smokeless tobacco: Never  Substance Use Topics   Alcohol use: Yes    Alcohol/week: 2.0 standard drinks of alcohol    Types: 2 Standard drinks or equivalent per week    No Known Allergies  Current Outpatient Medications  Medication Sig Dispense Refill   Cholecalciferol (VITAMIN D3 PO) Take by mouth daily.     Coenzyme Q10 (CO Q10 PO) Take by mouth daily.     MAGNESIUM PO Take by mouth daily.     Multiple Vitamins-Minerals (MULTIVITAMIN ADULTS 50+) TABS Take by mouth daily.  olmesartan (BENICAR) 20 MG tablet Take 20 mg by mouth daily.     No current facility-administered medications for this visit.    REVIEW OF SYSTEMS:  [X]  denotes positive finding, [ ]  denotes negative finding Cardiac  Comments:  Chest pain or chest pressure:    Shortness of breath upon exertion:    Short of breath when lying flat:    Irregular heart rhythm:        Vascular    Pain in calf, thigh, or hip brought on by ambulation:    Pain in feet at night that wakes you up from your sleep:     Blood clot in your veins:    Leg swelling:         Pulmonary    Oxygen at home:    Productive cough:     Wheezing:         Neurologic    Sudden weakness in arms or  legs:     Sudden numbness in arms or legs:     Sudden onset of difficulty speaking or slurred speech:    Temporary loss of vision in one eye:     Problems with dizziness:         Gastrointestinal    Blood in stool:     Vomited blood:         Genitourinary    Burning when urinating:     Blood in urine:        Psychiatric    Major depression:         Hematologic    Bleeding problems:    Problems with blood clotting too easily:        Skin    Rashes or ulcers:        Constitutional    Fever or chills:    -  PHYSICAL EXAM:   Vitals:   02/06/23 1551 02/06/23 1553  BP: (!) 145/80 (!) 155/82  Pulse: (!) 51   Resp: 20   Temp: 98 F (36.7 C)   SpO2: 96%   Weight: 185 lb 14.4 oz (84.3 kg)   Height: 6' (1.829 m)    Body mass index is 25.21 kg/m. GENERAL: The patient is a well-nourished male, in no acute distress. The vital signs are documented above. CARDIAC: There is a regular rate and rhythm.  VASCULAR: He has a right carotid bruit. He has palpable femoral and posterior tibial pulses bilaterally. PULMONARY: There is good air exchange bilaterally without wheezing or rales. ABDOMEN: Soft and non-tender with normal pitched bowel sounds.  I do not palpate an aneurysm. MUSCULOSKELETAL: There are no major deformities. NEUROLOGIC: No focal weakness or paresthesias are detected. SKIN: There are no ulcers or rashes noted. PSYCHIATRIC: The patient has a normal affect.  DATA:    CAROTID DUPLEX: I reviewed his carotid duplex scan that was done elsewhere on 11/19/2022.  On the right side peak systolic velocity was 202 cm/s with an end-diastolic velocity of 65 cm/s.  By our velocity criteria this would just put him in the 60 to 79% category at the very low end of that spectrum.  On the left side he had no significant carotid stenosis.  CT ANGIO NECK: I reviewed the images of his CT angio of the neck that was done on 12/23/2022.  This was read as having a 55% right ICA stenosis.   The stenosis was fairly smooth and extended over a fairly long length in the internal carotid artery.  There was no significant  stenosis on the left.  Waverly Ferrarihristopher Haillie Radu [facility] [place]

## 2023-02-10 ENCOUNTER — Other Ambulatory Visit: Payer: Self-pay

## 2023-02-10 DIAGNOSIS — I6521 Occlusion and stenosis of right carotid artery: Secondary | ICD-10-CM

## 2023-04-28 ENCOUNTER — Encounter (INDEPENDENT_AMBULATORY_CARE_PROVIDER_SITE_OTHER): Payer: Self-pay | Admitting: *Deleted

## 2023-05-01 DIAGNOSIS — Z1211 Encounter for screening for malignant neoplasm of colon: Secondary | ICD-10-CM | POA: Diagnosis not present

## 2023-05-07 LAB — COLOGUARD: COLOGUARD: NEGATIVE

## 2023-05-07 LAB — EXTERNAL GENERIC LAB PROCEDURE: COLOGUARD: NEGATIVE

## 2023-07-28 DIAGNOSIS — R030 Elevated blood-pressure reading, without diagnosis of hypertension: Secondary | ICD-10-CM | POA: Diagnosis not present

## 2023-07-28 DIAGNOSIS — M25551 Pain in right hip: Secondary | ICD-10-CM | POA: Diagnosis not present

## 2023-07-28 DIAGNOSIS — M797 Fibromyalgia: Secondary | ICD-10-CM | POA: Diagnosis not present

## 2023-07-28 DIAGNOSIS — E039 Hypothyroidism, unspecified: Secondary | ICD-10-CM | POA: Diagnosis not present

## 2023-08-14 ENCOUNTER — Ambulatory Visit (HOSPITAL_COMMUNITY)
Admission: RE | Admit: 2023-08-14 | Discharge: 2023-08-14 | Disposition: A | Discharge status: Home / Self Care | Payer: BC Managed Care – PPO | Source: Ambulatory Visit | Attending: Surgery | Admitting: Surgery

## 2023-08-14 ENCOUNTER — Ambulatory Visit: Payer: BC Managed Care – PPO | Admitting: Physician Assistant

## 2023-08-14 VITALS — BP 153/84 | HR 67 | Temp 97.7°F | Resp 18 | Ht 72.0 in | Wt 189.0 lb

## 2023-08-14 DIAGNOSIS — I6521 Occlusion and stenosis of right carotid artery: Secondary | ICD-10-CM | POA: Diagnosis not present

## 2023-08-14 NOTE — Progress Notes (Signed)
Office Note   History of Present Illness   Mario Barajas is a 62 y.o. (15-Apr-1961) male who presents for surveillance of carotid artery stenosis.  He has no prior history of stroke or TIA.  The patient returns today for follow up.  He denies any recent strokelike symptoms such as amaurosis fugax, expressive or receptive aphasia, sudden weakness/numbness, or dizziness.  He has not seen his PCP since his last follow-up to discuss starting a statin.  He has not started taking a baby aspirin.  He recently started taking Natto.  Current Outpatient Medications  Medication Sig Dispense Refill   Cholecalciferol (VITAMIN D3 PO) Take by mouth daily.     Coenzyme Q10 (CO Q10 PO) Take by mouth daily.     MAGNESIUM PO Take by mouth daily.     Multiple Vitamins-Minerals (MULTIVITAMIN ADULTS 50+) TABS Take by mouth daily.     olmesartan (BENICAR) 20 MG tablet Take 20 mg by mouth daily.     No current facility-administered medications for this visit.    REVIEW OF SYSTEMS (negative unless checked):   Cardiac:  []  Chest pain or chest pressure? []  Shortness of breath upon activity? []  Shortness of breath when lying flat? []  Irregular heart rhythm?  Vascular:  []  Pain in calf, thigh, or hip brought on by walking? []  Pain in feet at night that wakes you up from your sleep? []  Blood clot in your veins? []  Leg swelling?  Pulmonary:  []  Oxygen at home? []  Productive cough? []  Wheezing?  Neurologic:  []  Sudden weakness in arms or legs? []  Sudden numbness in arms or legs? []  Sudden onset of difficult speaking or slurred speech? []  Temporary loss of vision in one eye? []  Problems with dizziness?  Gastrointestinal:  []  Blood in stool? []  Vomited blood?  Genitourinary:  []  Burning when urinating? []  Blood in urine?  Psychiatric:  []  Major depression  Hematologic:  []  Bleeding problems? []  Problems with blood clotting?  Dermatologic:  []  Rashes or ulcers?  Constitutional:   []  Fever or chills?  Ear/Nose/Throat:  []  Change in hearing? []  Nose bleeds? []  Sore throat?  Musculoskeletal:  []  Back pain? []  Joint pain? []  Muscle pain?   Physical Examination   Vitals:   08/14/23 1306  BP: (!) 153/84  Pulse: 67  Resp: 18  Temp: 97.7 F (36.5 C)  SpO2: 96%  Weight: 189 lb (85.7 kg)  Height: 6' (1.829 m)   Body mass index is 25.63 kg/m.  General:  WDWN in NAD; vital signs documented above Gait: Not observed HENT: WNL, normocephalic Pulmonary: normal non-labored breathing , without rales, rhonchi,  wheezing Cardiac: regular Abdomen: soft, NT, no masses Skin: without rashes Vascular Exam/Pulses: palpable radial pulses bilaterally Extremities: without ischemic changes, without gangrene , without cellulitis; without open wounds;  Musculoskeletal: no muscle wasting or atrophy  Neurologic: A&O X 3;  No focal weakness or paresthesias are detected Psychiatric:  The pt has Normal affect.  Non-Invasive Vascular Imaging   Bilateral Carotid Duplex (08/14/2023):  R ICA stenosis:  60-79% R VA:  patent and antegrade L ICA stenosis:  1-39% L VA:  patent and antegrade   Medical Decision Making   Mario Barajas is a 62 y.o. male who presents for surveillance of carotid artery stenosis  Based on the patient's vascular studies, his carotid artery stenosis is unchanged bilaterally.  His right ICA stenosis is 60-79% and left ICA stenosis is 1-39% He denies any strokelike symptoms such as slurred speech, facial  droop, sudden visual changes, or sudden weakness/numbness There are palpable and equal radial pulses bilaterally I have encouraged him to stop taking Natto and begin taking a daily baby aspirin.  He will also discuss with his PCP starting a statin at his next physical.  He can follow-up with our office in 6 months with repeat carotid duplex   Loel Dubonnet PA-C [facility] [place] Office: [phone]  Clinic MD: Karin Lieu

## 2023-09-09 ENCOUNTER — Other Ambulatory Visit: Payer: Self-pay

## 2023-09-09 DIAGNOSIS — I6521 Occlusion and stenosis of right carotid artery: Secondary | ICD-10-CM

## 2023-12-08 DIAGNOSIS — E782 Mixed hyperlipidemia: Secondary | ICD-10-CM | POA: Diagnosis not present

## 2023-12-08 DIAGNOSIS — Z0001 Encounter for general adult medical examination with abnormal findings: Secondary | ICD-10-CM | POA: Diagnosis not present

## 2023-12-08 DIAGNOSIS — R7301 Impaired fasting glucose: Secondary | ICD-10-CM | POA: Diagnosis not present

## 2024-02-12 ENCOUNTER — Ambulatory Visit (HOSPITAL_COMMUNITY)
Admission: RE | Admit: 2024-02-12 | Discharge: 2024-02-12 | Disposition: A | Discharge status: Home / Self Care | Payer: BC Managed Care – PPO | Source: Ambulatory Visit | Attending: Vascular Surgery | Admitting: Vascular Surgery

## 2024-02-12 ENCOUNTER — Ambulatory Visit: Payer: BC Managed Care – PPO | Admitting: Physician Assistant

## 2024-02-12 VITALS — BP 172/99 | HR 53 | Temp 97.7°F | Ht 72.0 in | Wt 190.5 lb

## 2024-02-12 DIAGNOSIS — I6523 Occlusion and stenosis of bilateral carotid arteries: Secondary | ICD-10-CM

## 2024-02-12 DIAGNOSIS — I6521 Occlusion and stenosis of right carotid artery: Secondary | ICD-10-CM | POA: Insufficient documentation

## 2024-02-12 NOTE — Progress Notes (Signed)
 Office Note   History of Present Illness   Mario Barajas is a 63 y.o. (09/24/1961) male who presents for surveillance of carotid artery stenosis.  He has no prior history of stroke or TIA.  He returns today for follow-up. He denies any recent strokelike symptoms such as slurred speech, facial droop, sudden visual changes, or sudden weakness/numbness.  He is currently not taking a baby aspirin or statin.  He says that he would like to treat his stenosis homeopathically.  He has not taken his blood pressure medication today.  Current Outpatient Medications  Medication Sig Dispense Refill   Cholecalciferol (VITAMIN D3 PO) Take by mouth daily.     Coenzyme Q10 (CO Q10 PO) Take by mouth daily.     MAGNESIUM PO Take by mouth daily.     Multiple Vitamins-Minerals (MULTIVITAMIN ADULTS 50+) TABS Take by mouth daily.     olmesartan (BENICAR) 20 MG tablet Take 20 mg by mouth daily.     No current facility-administered medications for this visit.    REVIEW OF SYSTEMS (negative unless checked):   Cardiac:  []  Chest pain or chest pressure? []  Shortness of breath upon activity? []  Shortness of breath when lying flat? []  Irregular heart rhythm?  Vascular:  []  Pain in calf, thigh, or hip brought on by walking? []  Pain in feet at night that wakes you up from your sleep? []  Blood clot in your veins? []  Leg swelling?  Pulmonary:  []  Oxygen at home? []  Productive cough? []  Wheezing?  Neurologic:  []  Sudden weakness in arms or legs? []  Sudden numbness in arms or legs? []  Sudden onset of difficult speaking or slurred speech? []  Temporary loss of vision in one eye? []  Problems with dizziness?  Gastrointestinal:  []  Blood in stool? []  Vomited blood?  Genitourinary:  []  Burning when urinating? []  Blood in urine?  Psychiatric:  []  Major depression  Hematologic:  []  Bleeding problems? []  Problems with blood clotting?  Dermatologic:  []  Rashes or ulcers?  Constitutional:   []  Fever or chills?  Ear/Nose/Throat:  []  Change in hearing? []  Nose bleeds? []  Sore throat?  Musculoskeletal:  []  Back pain? []  Joint pain? []  Muscle pain?   Physical Examination   Vitals:   02/12/24 1059 02/12/24 1102  BP: (!) 180/103 (!) 172/99  Pulse: (!) 53   Temp: 97.7 F (36.5 C)   TempSrc: Temporal   SpO2: 98%   Weight: 190 lb 8 oz (86.4 kg)   Height: 6' (1.829 m)    Body mass index is 25.84 kg/m.  General:  WDWN in NAD; vital signs documented above Gait: Not observed HENT: WNL, normocephalic Pulmonary: normal non-labored breathing , without rales, rhonchi,  wheezing Cardiac: regular Abdomen: soft, NT, no masses Skin: without rashes Vascular Exam/Pulses: palpable radial pulses bilaterally Extremities: without ischemic changes, without gangrene , without cellulitis; without open wounds;  Musculoskeletal: no muscle wasting or atrophy  Neurologic: A&O X 3;  No focal weakness or paresthesias are detected Psychiatric:  The pt has Normal affect.  Non-Invasive Vascular Imaging   Bilateral Carotid Duplex (02/12/2024):  R ICA stenosis:  40-59% R VA:  patent and antegrade L ICA stenosis:  1-39% L VA:  patent and antegrade   Medical Decision Making   Mario Barajas is a 63 y.o. male who presents for surveillance of carotid artery stenosis  Based on the patient's vascular studies, his carotid artery stenosis is unchanged bilaterally.  His right ICA stenosis is borderline between 40 to 59%  and 60 to 79%.  His left ICA stenosis is 1 to 39% He denies any strokelike symptoms such as slurred speech, facial droop, sudden visual changes, or sudden weakness/numbness.  On exam he has palpable and equal radial pulses.  His blood pressure is very high today but he says that he has not taken his medication yet. At this time he does not want to take a daily baby aspirin or statin.  I encouraged him to reconsider this, as it will decrease his risk of stroke. He can follow-up  with our office in 6 months with repeat carotid duplex   Deneise Finlay PA-C [facility] [place] Office: [phone]  Clinic MD: Rosalva Comber

## 2024-02-14 ENCOUNTER — Ambulatory Visit: Admission: EM | Admit: 2024-02-14 | Discharge: 2024-02-14 | Disposition: A | Discharge status: Home / Self Care

## 2024-02-14 DIAGNOSIS — R030 Elevated blood-pressure reading, without diagnosis of hypertension: Secondary | ICD-10-CM | POA: Diagnosis not present

## 2024-02-14 DIAGNOSIS — I10 Essential (primary) hypertension: Secondary | ICD-10-CM | POA: Diagnosis not present

## 2024-02-14 NOTE — ED Provider Notes (Signed)
 [facility]    CSN: [id number] Arrival date & time: 02/14/24  0815      History   Chief Complaint No chief complaint on file.   HPI Mario Barajas is a 63 y.o. male.   Patient presenting today with several day history of concern for elevated blood pressure readings at home.  States he has been on olmesartan 20 mg for several years now and has typically had good blood pressure control on this but notes the past few days his blood pressure readings have been in the 180s over 90s to 180s over 110s.  Denies associated chest pain, shortness of breath, dizziness, visual change, palpitations.  Has been compliant with his dosing and no recent lifestyle changes.  Tried to get in with his primary care but unable to during the holiday weekend.    Past Medical History:  Diagnosis Date   Acute otitis media 12/22/2021   Carotid atherosclerosis 09/10/2021   Essential hypertension 08/29/2021   Impaired fasting glucose 11/07/2022   Lung nodule 09/10/2021   Melanocytic nevus 08/29/2021   Mixed hyperlipidemia 08/29/2021   Seborrheic keratosis 08/29/2021   Skin lesion 09/10/2021    Patient Active Problem List   Diagnosis Date Noted   Impaired fasting glucose 11/07/2022   Acute otitis media 12/22/2021   Carotid atherosclerosis 09/10/2021   Lung nodule 09/10/2021   Skin lesion 09/10/2021   Essential hypertension 08/29/2021   Melanocytic nevus 08/29/2021   Mixed hyperlipidemia 08/29/2021   Seborrheic keratosis 08/29/2021    Past Surgical History:  Procedure Laterality Date   BACK SURGERY  2010   TONSILLECTOMY AND ADENOIDECTOMY  1966       Home Medications    Prior to Admission medications   Medication Sig Start Date End Date Taking? Authorizing Provider  Cholecalciferol (VITAMIN D3 PO) Take by mouth daily.    [provider]  Coenzyme Q10 (CO Q10 PO) Take by mouth daily.    [provider]  MAGNESIUM PO Take by mouth daily.    [provider]  Multiple Vitamins-Minerals (MULTIVITAMIN ADULTS 50+) TABS Take by mouth daily.    [provider]  olmesartan (BENICAR) 20 MG tablet Take 20 mg by mouth daily.    [provider]    Family History Family History  Problem Relation Age of Onset   Heart disease Mother    Cancer Father    Cancer Sister     Social History Social History   Tobacco Use   Smoking status: Never   Smokeless tobacco: Never  Vaping Use   Vaping status: Never Used  Substance Use Topics   Alcohol use: Yes    Alcohol/week: 2.0 standard drinks of alcohol    Types: 2 Standard drinks or equivalent per week   Drug use: Never     Allergies   Patient has no known allergies.   Review of Systems Review of Systems Per HPI  Physical Exam Triage Vital Signs ED Triage Vitals  Encounter Vitals Group     BP 02/14/24 0826 (!) 190/104     Systolic BP Percentile --      Diastolic BP Percentile --      Pulse Rate 02/14/24 0826 61     Resp 02/14/24 0826 18     Temp 02/14/24 0826 98.3 F (36.8 C)     Temp Source 02/14/24 0826 Oral     SpO2 02/14/24 0826 97 %     Weight --      Height --  Head Circumference --      Peak Flow --      Pain Score 02/14/24 0827 0     Pain Loc --      Pain Education --      Exclude from Growth Chart --    No data found.  Updated Vital Signs BP (!) 190/104 (BP Location: Right Arm)   Pulse 61   Temp 98.3 F (36.8 C) (Oral)   Resp 18   SpO2 97%   Visual Acuity Right Eye Distance:   Left Eye Distance:   Bilateral Distance:    Right Eye Near:   Left Eye Near:    Bilateral Near:     Physical Exam Vitals and nursing note reviewed.  Constitutional:      Appearance: Normal appearance.  HENT:     Head: Atraumatic.     Mouth/Throat:     Mouth: Mucous membranes are moist.     Pharynx: Oropharynx is clear.  Eyes:     Extraocular Movements: Extraocular movements intact.     Conjunctiva/sclera: Conjunctivae normal.  Cardiovascular:      Rate and Rhythm: Normal rate.  Pulmonary:     Effort: Pulmonary effort is normal.     Breath sounds: No wheezing or rales.  Musculoskeletal:        General: Normal range of motion.     Cervical back: Normal range of motion and neck supple.  Skin:    General: Skin is warm and dry.  Neurological:     Mental Status: He is oriented to person, place, and time.     Motor: No weakness.     Gait: Gait normal.  Psychiatric:        Mood and Affect: Mood normal.        Thought Content: Thought content normal.        Judgment: Judgment normal.      UC Treatments / Results  Labs (all labs ordered are listed, but only abnormal results are displayed) Labs Reviewed - No data to display  EKG   Radiology VAS US  CAROTID Result Date: 02/12/2024 Carotid Arterial Duplex Study Patient Name:  Mario Barajas  Date of Exam:   02/12/2024 Medical Rec #: [id number]      Accession #:    [id number] Date of Birth: 12-30-1960      Patient Gender: M Patient Age:   43 years Exam Location:  [facility] Procedure:      VAS US  CAROTID Referring Phys: Melodie Spry ROBINS --------------------------------------------------------------------------------  Indications:       Carotid artery disease. Comparison Study:  08/14/23: Right 60-79% Left 1-39% Performing Technologist: Helon Lobos RVT  Examination Guidelines: A complete evaluation includes B-mode imaging, spectral Doppler, color Doppler, and power Doppler as needed of all accessible portions of each vessel. Bilateral testing is considered an integral part of a complete examination. Limited examinations for reoccurring indications may be performed as noted.  Right Carotid Findings: +----------+--------+--------+--------+------------------+--------+           PSV cm/sEDV cm/sStenosisPlaque DescriptionComments +----------+--------+--------+--------+------------------+--------+ CCA Prox  85      14                                          +----------+--------+--------+--------+------------------+--------+ CCA Mid   80      15                                         +----------+--------+--------+--------+------------------+--------+  CCA Distal62      17                                         +----------+--------+--------+--------+------------------+--------+ ICA Prox  199     54      40-59%  heterogenous               +----------+--------+--------+--------+------------------+--------+ ICA Distal61      26                                         +----------+--------+--------+--------+------------------+--------+ ECA       68      9               heterogenous               +----------+--------+--------+--------+------------------+--------+ +----------+--------+-------+----------------+-------------------+           PSV cm/sEDV cmsDescribe        Arm Pressure (mmHG) +----------+--------+-------+----------------+-------------------+ Subclavian208     0      Multiphasic, WNL                    +----------+--------+-------+----------------+-------------------+ +---------+--------+--+--------+--+---------+ VertebralPSV cm/s49EDV cm/s13Antegrade +---------+--------+--+--------+--+---------+  Left Carotid Findings: +----------+--------+--------+--------+------------------+--------+           PSV cm/sEDV cm/sStenosisPlaque DescriptionComments +----------+--------+--------+--------+------------------+--------+ CCA Prox  94      17                                         +----------+--------+--------+--------+------------------+--------+ CCA Mid   72      14                                         +----------+--------+--------+--------+------------------+--------+ CCA Distal50      11                                         +----------+--------+--------+--------+------------------+--------+ ICA Prox  60      14      1-39%   heterogenous                +----------+--------+--------+--------+------------------+--------+ ICA Mid   82      30                                         +----------+--------+--------+--------+------------------+--------+ ICA Distal56      20                                         +----------+--------+--------+--------+------------------+--------+ ECA       58      6               heterogenous               +----------+--------+--------+--------+------------------+--------+ +----------+--------+--------+--------+-------------------+  PSV cm/sEDV cm/sDescribeArm Pressure (mmHG) +----------+--------+--------+--------+-------------------+ Subclavian99      0                                   +----------+--------+--------+--------+-------------------+ +---------+--------+--+--------+--+ VertebralPSV cm/s48EDV cm/s14 +---------+--------+--+--------+--+   Summary: Right Carotid: Velocities in the right ICA are consistent with a 40-59%                stenosis. Left Carotid: Velocities in the left ICA are consistent with a 1-39% stenosis. Vertebrals:  Bilateral vertebral arteries demonstrate antegrade flow. Subclavians: Normal flow hemodynamics were seen in bilateral subclavian              arteries. *See table(s) above for measurements and observations.  Electronically signed by Irvin Mantel on 02/12/2024 at 5:37:56 PM.    Final     Procedures Procedures (including critical care time)  Medications Ordered in UC Medications - No data to display  Initial Impression / Assessment and Plan / UC Course  I have reviewed the triage vital signs and the nursing notes.  Pertinent labs & imaging results that were available during my care of the patient were reviewed by me and considered in my medical decision making (see chart for details).     Blood pressure significantly elevated again today, he is well-appearing and in no acute distress.  No red flag findings today during evaluation.  Will  increase to 2 tablets of the 20 mg olmesartan daily for the next few days and closely monitor home readings, logging them to bring with him to his upcoming primary care follow-up next week.  Discussed lifestyle modifications and to discontinue the additional dose of 20 mg if blood pressure readings are dropping below 120/80 range.  Return sooner for any worsening symptoms.  Final Clinical Impressions(s) / UC Diagnoses   Final diagnoses:  Elevated blood pressure reading  Essential hypertension     Discharge Instructions      Take 2 of the 20 mg olmesartan tablets daily and closely monitor your home blood pressure readings, writing down the readings to bring the data to your primary care follow-up next week.  If your readings drop below 120/80 in any point go ahead and stop the extra 20 mg tablet and go back to your typical dose until you are able to speak with your primary care provider.    ED Prescriptions   None    PDMP not reviewed this encounter.   Corbin Dess, [place] 02/14/24 1026

## 2024-02-14 NOTE — ED Triage Notes (Signed)
 Pt reports he checked his BP this morning and it was was 180/113 this morning. States he has been having elevated readings for "a couple of days".  Stated he has a slight headache

## 2024-02-14 NOTE — Discharge Instructions (Signed)
 Take 2 of the 20 mg olmesartan tablets daily and closely monitor your home blood pressure readings, writing down the readings to bring the data to your primary care follow-up next week.  If your readings drop below 120/80 in any point go ahead and stop the extra 20 mg tablet and go back to your typical dose until you are able to speak with your primary care provider.

## 2024-02-26 ENCOUNTER — Other Ambulatory Visit: Payer: Self-pay | Admitting: *Deleted

## 2024-02-26 DIAGNOSIS — I6523 Occlusion and stenosis of bilateral carotid arteries: Secondary | ICD-10-CM

## 2024-03-05 DIAGNOSIS — Z23 Encounter for immunization: Secondary | ICD-10-CM | POA: Diagnosis not present

## 2024-03-05 DIAGNOSIS — Z0001 Encounter for general adult medical examination with abnormal findings: Secondary | ICD-10-CM | POA: Diagnosis not present

## 2024-03-05 DIAGNOSIS — M7712 Lateral epicondylitis, left elbow: Secondary | ICD-10-CM | POA: Diagnosis not present

## 2024-03-05 DIAGNOSIS — E782 Mixed hyperlipidemia: Secondary | ICD-10-CM | POA: Diagnosis not present

## 2024-03-05 DIAGNOSIS — I10 Essential (primary) hypertension: Secondary | ICD-10-CM | POA: Diagnosis not present

## 2024-03-05 DIAGNOSIS — Z0000 Encounter for general adult medical examination without abnormal findings: Secondary | ICD-10-CM | POA: Diagnosis not present

## 2024-03-05 DIAGNOSIS — R911 Solitary pulmonary nodule: Secondary | ICD-10-CM | POA: Diagnosis not present

## 2024-04-16 DIAGNOSIS — I6529 Occlusion and stenosis of unspecified carotid artery: Secondary | ICD-10-CM | POA: Diagnosis not present

## 2024-04-16 DIAGNOSIS — I10 Essential (primary) hypertension: Secondary | ICD-10-CM | POA: Diagnosis not present

## 2024-04-16 DIAGNOSIS — Z9109 Other allergy status, other than to drugs and biological substances: Secondary | ICD-10-CM | POA: Diagnosis not present

## 2024-04-16 DIAGNOSIS — E782 Mixed hyperlipidemia: Secondary | ICD-10-CM | POA: Diagnosis not present

## 2024-06-17 ENCOUNTER — Ambulatory Visit (INDEPENDENT_AMBULATORY_CARE_PROVIDER_SITE_OTHER): Admitting: Audiology

## 2024-06-17 ENCOUNTER — Ambulatory Visit (INDEPENDENT_AMBULATORY_CARE_PROVIDER_SITE_OTHER): Admitting: Otolaryngology

## 2024-07-19 ENCOUNTER — Encounter (INDEPENDENT_AMBULATORY_CARE_PROVIDER_SITE_OTHER): Payer: Self-pay | Admitting: Audiology

## 2024-07-19 ENCOUNTER — Ambulatory Visit (INDEPENDENT_AMBULATORY_CARE_PROVIDER_SITE_OTHER): Admitting: Otolaryngology

## 2024-07-19 ENCOUNTER — Encounter (INDEPENDENT_AMBULATORY_CARE_PROVIDER_SITE_OTHER): Payer: Self-pay | Admitting: Otolaryngology

## 2024-07-19 ENCOUNTER — Ambulatory Visit (INDEPENDENT_AMBULATORY_CARE_PROVIDER_SITE_OTHER): Admitting: Audiology

## 2024-07-19 VITALS — BP 143/82 | HR 56 | Temp 98.1°F | Ht 72.0 in | Wt 190.0 lb

## 2024-07-19 DIAGNOSIS — H903 Sensorineural hearing loss, bilateral: Secondary | ICD-10-CM

## 2024-07-19 DIAGNOSIS — H6121 Impacted cerumen, right ear: Secondary | ICD-10-CM

## 2024-07-19 NOTE — Progress Notes (Unsigned)
  [address] [place], [place] [address] [phone]  Audiological Evaluation    Name: Mario Barajas     DOB:   [birth year]      MRN:   [id number]                                                                                     Service Date: 07/19/2024     Accompanied by: unaccompanied    Patient comes today after Dr. Karis, ENT sent a referral for a hearing evaluation due to concerns with hearing loss.   Symptoms Yes Details  Hearing loss  [x]  Reports last tie he was tested was on 06-20-2022 and would lke to know if he has had any further hearing decline  Tinnitus  [x]  Reported tinnitus  Ear pain/ infections/pressure  []    Balance problems  []    Noise exposure history  [x]  [employer]  Previous ear surgeries  []    Family history of hearing loss  []    Amplification  [x]  Has a set of aids that were fit at [facility]  Other  []      Otoscopy: Right ear: Clear external ear canal and notable landmarks visualized on the tympanic membrane. Left ear:  Clear external ear canal and notable landmarks visualized on the tympanic membrane.  Tympanometry: Right ear: Type A- Normal external ear canal volume with normal middle ear pressure and tympanic membrane compliance. Left ear: Type Ad- Normal external ear canal volume with normal middle ear pressure and high tympanic membrane compliance.  Pure tone Audiometry: Right ear- Normal to severe sensorineural hearing loss from 250 Hz - 8000 Hz. Left ear-  Normal to severe sensorineural hearing loss from 250 Hz - 8000 Hz.  Speech Audiometry: Right ear- Speech Reception Threshold (SRT) was obtained at 30 dBHL. Left ear-Speech Reception Threshold (SRT) was obtained at 30 dBHL.   Word Recognition Score Tested using NU-6 (recorded) Right ear: 88% was obtained at a presentation level of 75 dBHL with contralateral masking which is deemed as  good . Left ear: 92% was obtained at a presentation level of 75 dBHL with contralateral  masking which is deemed as  excellent.   The hearing test results were completed under headphones and results are deemed to be of good reliability. Test technique:  conventional    Impression: There is not a significant difference in pure-tone thresholds between ears.There is not a significant difference in the word recognition score in between ears. No overall changes noted when today's audiogram was compared to his previous one from 06-20-2022   Recommendations: Follow up with ENT as scheduled for today. Return for a hearing evaluation if concerns with hearing changes arise or per MD recommendation. Consider continue using hearing aids.     [name] MARIE LEROUX-MARTINEZ, AUD

## 2024-07-20 DIAGNOSIS — H903 Sensorineural hearing loss, bilateral: Secondary | ICD-10-CM | POA: Insufficient documentation

## 2024-07-20 DIAGNOSIS — H6121 Impacted cerumen, right ear: Secondary | ICD-10-CM | POA: Insufficient documentation

## 2024-07-20 NOTE — Progress Notes (Signed)
 Patient ID: Mario Barajas, male   DOB: 26-Mar-1961, 63 y.o.   MRN: [id number]  Follow-up: Bilateral progressive hearing loss  HPI: The patient is a 63 year old male who returns today for his follow-up evaluation.  The patient was last seen in August 2023.  At that time, he was noted to have bilateral high-frequency sensorineural hearing loss.  He was fitted with bilateral hearing aids.  The patient returns today complaining of bilateral progressive hearing difficulty.  The hearing aids have helped.  Currently he denies any otalgia, otorrhea, or vertigo.  Exam: General: Communicates without difficulty, well nourished, no acute distress. Head: Normocephalic, no evidence injury, no tenderness, facial buttresses intact without stepoff. Face/sinus: No tenderness to palpation and percussion. Facial movement is normal and symmetric. Eyes: PERRL, EOMI. No scleral icterus, conjunctivae clear. Neuro: CN II exam reveals vision grossly intact.  No nystagmus at any point of gaze. Ears: Auricles well formed without lesions.  Right ear cerumen impaction.  The left ear canal and tympanic membrane are normal.  Nose: External evaluation reveals normal support and skin without lesions.  Dorsum is intact.  Anterior rhinoscopy reveals congested mucosa over anterior aspect of inferior turbinates and intact septum.  No purulence noted. Oral:  Oral cavity and oropharynx are intact, symmetric, without erythema or edema.  Mucosa is moist without lesions. Neck: Full range of motion without pain.  There is no significant lymphadenopathy.  No masses palpable.  Thyroid bed within normal limits to palpation.  Parotid glands and submandibular glands equal bilaterally without mass.  Trachea is midline. Neuro:  CN 2-12 grossly intact.   Procedure: Right ear cerumen disimpaction Anesthesia: None Description: Under the operating microscope, the cerumen is carefully removed with a combination of cerumen currette, alligator forceps, and suction  catheters.  After the cerumen is removed, the TMs are noted to be normal.  No mass, erythema, or lesions. The patient tolerated the procedure well.     His hearing test shows bilateral symmetric high-frequency sensorineural hearing loss, slightly worse than his previous audiogram in 2023.  Assessment: 1.  Mildly progressive bilateral high-frequency sensorineural hearing loss. 2.  Right ear cerumen impaction.  After the disimpaction procedure, both tympanic membranes and middle ear spaces are noted to be normal.  Plan: 1.  Otomicroscopy with right ear cerumen disimpaction. 2.  The physical exam findings and the hearing test results are reviewed with the patient. 3.  Continue the use of his hearing aids. 4.  The patient will return for reevaluation in 1 year.

## 2024-08-12 ENCOUNTER — Ambulatory Visit

## 2024-08-12 ENCOUNTER — Ambulatory Visit (HOSPITAL_COMMUNITY)
Admission: RE | Admit: 2024-08-12 | Discharge: 2024-08-12 | Disposition: A | Discharge status: Home / Self Care | Source: Ambulatory Visit | Attending: Physician Assistant | Admitting: Physician Assistant

## 2024-08-12 VITALS — BP 158/87 | HR 49 | Temp 97.7°F | Wt 192.6 lb

## 2024-08-12 DIAGNOSIS — I6523 Occlusion and stenosis of bilateral carotid arteries: Secondary | ICD-10-CM | POA: Diagnosis present

## 2024-08-12 NOTE — Progress Notes (Signed)
 Office Note   History of Present Illness   Mario Barajas is a 63 y.o. (11/08/1960) male who presents for surveillance of carotid artery stenosis.  He has no prior history of stroke or TIA.  He has no prior history of carotid revascularization.  He returns today for follow-up.  He has no complaints at today's office visit.  He denies any strokelike symptoms such as slurred speech, facial droop, amaurosis fugax, or sudden weakness/numbness.  He just recently retired in July and went on a 3-week trip to [place]  to celebrate.  Current Outpatient Medications  Medication Sig Dispense Refill   amLODipine (NORVASC) 5 MG tablet Take 5 mg by mouth at bedtime.     Cholecalciferol (VITAMIN D3 PO) Take by mouth daily.     Coenzyme Q10 (CO Q10 PO) Take by mouth daily.     MAGNESIUM PO Take by mouth daily.     Multiple Vitamins-Minerals (MULTIVITAMIN ADULTS 50+) TABS Take by mouth daily.     olmesartan (BENICAR) 40 MG tablet Take 40 mg by mouth daily.     No current facility-administered medications for this visit.    REVIEW OF SYSTEMS (negative unless checked):   Cardiac:  []  Chest pain or chest pressure? []  Shortness of breath upon activity? []  Shortness of breath when lying flat? []  Irregular heart rhythm?  Vascular:  []  Pain in calf, thigh, or hip brought on by walking? []  Pain in feet at night that wakes you up from your sleep? []  Blood clot in your veins? []  Leg swelling?  Pulmonary:  []  Oxygen at home? []  Productive cough? []  Wheezing?  Neurologic:  []  Sudden weakness in arms or legs? []  Sudden numbness in arms or legs? []  Sudden onset of difficult speaking or slurred speech? []  Temporary loss of vision in one eye? []  Problems with dizziness?  Gastrointestinal:  []  Blood in stool? []  Vomited blood?  Genitourinary:  []  Burning when urinating? []  Blood in urine?  Psychiatric:  []  Major depression  Hematologic:  []  Bleeding problems? []  Problems with blood  clotting?  Dermatologic:  []  Rashes or ulcers?  Constitutional:  []  Fever or chills?  Ear/Nose/Throat:  []  Change in hearing? []  Nose bleeds? []  Sore throat?  Musculoskeletal:  []  Back pain? []  Joint pain? []  Muscle pain?   Physical Examination   Vitals:   08/12/24 1319 08/12/24 1320  BP: (!) 162/92 (!) 158/87  Pulse: (!) 45 (!) 49  Temp: 97.7 F (36.5 C)   TempSrc: Temporal   Weight: 192 lb 9.6 oz (87.4 kg)    Body mass index is 26.12 kg/m.  General:  WDWN in NAD; vital signs documented above Gait: Not observed HENT: WNL, normocephalic Pulmonary: normal non-labored breathing , without rales, rhonchi,  wheezing Cardiac: Regular Abdomen: soft, NT, no masses Skin: without rashes Vascular Exam/Pulses: palpable radial pulses bilaterally Extremities: without ischemic changes, without gangrene , without cellulitis; without open wounds;  Musculoskeletal: no muscle wasting or atrophy  Neurologic: A&O X 3;  No focal weakness or paresthesias are detected Psychiatric:  The pt has Normal affect.  Non-Invasive Vascular Imaging   Bilateral Carotid Duplex (08/12/2024):  R ICA stenosis:  40-59% R VA:  patent and antegrade L ICA stenosis:  1-39% L VA:  patent and antegrade   Medical Decision Making   Mario Barajas is a 63 y.o. male who presents for surveillance of carotid artery stenosis  Based on the patient's vascular studies, his carotid artery stenosis is unchanged.  His right ICA  stenosis is around 60%.  His left ICA stenosis is 1 to 39% He denies any strokelike symptoms such as slurred speech, facial droop, sudden visual changes, or sudden weakness/numbness.  He is neurologically intact on exam.  He has palpable and equal radial pulses bilaterally He can follow-up with our office in 6 months with repeat carotid duplex   Ahmed Holster PA-C [facility] Office: [phone]  Clinic MD: [place]

## 2024-08-16 ENCOUNTER — Other Ambulatory Visit: Payer: Self-pay

## 2024-08-16 DIAGNOSIS — I6523 Occlusion and stenosis of bilateral carotid arteries: Secondary | ICD-10-CM

## 2025-02-10 ENCOUNTER — Ambulatory Visit

## 2025-02-10 ENCOUNTER — Ambulatory Visit (HOSPITAL_COMMUNITY)
